# Patient Record
Sex: Female | Born: 1960 | Race: White | Hispanic: No | Marital: Single | Smoking: Current every day smoker
Health system: Southern US, Community
[De-identification: ages and names within clinical notes are randomized; demographics above are authoritative.]

## PROBLEM LIST (undated history)

## (undated) DIAGNOSIS — F32A Depression, unspecified: Secondary | ICD-10-CM

## (undated) DIAGNOSIS — N200 Calculus of kidney: Secondary | ICD-10-CM

## (undated) HISTORY — DX: Calculus of kidney: N20.0

## (undated) HISTORY — DX: Depression, unspecified: F32.A

---

## 2005-03-01 ENCOUNTER — Emergency Department: Payer: Self-pay | Admitting: Emergency Medicine

## 2008-03-31 DIAGNOSIS — E0590 Thyrotoxicosis, unspecified without thyrotoxic crisis or storm: Secondary | ICD-10-CM | POA: Insufficient documentation

## 2008-07-13 DIAGNOSIS — Z0000 Encounter for general adult medical examination without abnormal findings: Secondary | ICD-10-CM

## 2008-07-13 HISTORY — DX: Encounter for general adult medical examination without abnormal findings: Z00.00

## 2008-07-16 DIAGNOSIS — F17200 Nicotine dependence, unspecified, uncomplicated: Secondary | ICD-10-CM | POA: Insufficient documentation

## 2012-05-28 DIAGNOSIS — F411 Generalized anxiety disorder: Secondary | ICD-10-CM | POA: Insufficient documentation

## 2012-05-28 DIAGNOSIS — F1020 Alcohol dependence, uncomplicated: Secondary | ICD-10-CM | POA: Insufficient documentation

## 2012-06-01 DIAGNOSIS — E785 Hyperlipidemia, unspecified: Secondary | ICD-10-CM | POA: Insufficient documentation

## 2013-11-19 DIAGNOSIS — F4310 Post-traumatic stress disorder, unspecified: Secondary | ICD-10-CM | POA: Insufficient documentation

## 2018-10-08 ENCOUNTER — Ambulatory Visit
Admission: RE | Admit: 2018-10-08 | Discharge: 2018-10-08 | Disposition: A | Discharge status: Home / Self Care | Payer: Self-pay | Source: Ambulatory Visit | Attending: Oncology | Admitting: Oncology

## 2018-10-08 ENCOUNTER — Encounter: Payer: Self-pay | Admitting: *Deleted

## 2018-10-08 ENCOUNTER — Encounter (INDEPENDENT_AMBULATORY_CARE_PROVIDER_SITE_OTHER): Payer: Self-pay

## 2018-10-08 ENCOUNTER — Ambulatory Visit: Payer: Self-pay | Attending: Oncology | Admitting: *Deleted

## 2018-10-08 VITALS — BP 146/78 | HR 82 | Temp 98.0°F | Ht 65.6 in | Wt 151.8 lb

## 2018-10-08 DIAGNOSIS — Z0000 Encounter for general adult medical examination without abnormal findings: Secondary | ICD-10-CM

## 2018-10-08 NOTE — Progress Notes (Signed)
  Subjective:     Patient ID: Loreena Lemoine, female   DOB: 11-07-1960, 58 y.o.   MRN: [id number]  HPI   Review of Systems     Objective:   Physical Exam Chest:          Assessment:     58 year old White female referred to [facility] by [facility] for clinical breast exam and mammogram.  Clinical breast exam unremarkable.  Taught self breast awareness.  Last pap on 05/07/16 was negative without HPV co-testing.  Next pap due in August.  Informed patient she could have her pap with her primary care provider or we could do her pap next year when she returns for her annual screening.  Patient has been screened for eligibility.  She does not have any insurance, Medicare or Medicaid.  She also meets financial eligibility.  Hand-out given on the Affordable Care Act.  Risk Assessment    Risk Scores      10/08/2018   Last edited by: Alta Corning, CMA   5-year risk: 0.9 %   Lifetime risk: 5.7 %            Plan:     Screening mammogram ordered.  Will follow-up per [facility] protocol.  *

## 2018-10-08 NOTE — Patient Instructions (Signed)
Gave patient hand-out, Women Staying Healthy, Active and Well from [facility], with education on breast health, pap smears, heart and colon health. 

## 2018-10-21 ENCOUNTER — Encounter: Payer: Self-pay | Admitting: *Deleted

## 2018-10-21 NOTE — Progress Notes (Unsigned)
Letter mailed from the [facility] to inform patient of her normal mammogram results.  Patient is to follow-up with annual screening in one year.  HSIS to Christy. 

## 2020-11-05 ENCOUNTER — Ambulatory Visit
Admission: EM | Admit: 2020-11-05 | Discharge: 2020-11-05 | Disposition: A | Discharge status: Home / Self Care | Payer: Self-pay

## 2020-11-05 ENCOUNTER — Other Ambulatory Visit: Payer: Self-pay

## 2020-11-05 DIAGNOSIS — S46911A Strain of unspecified muscle, fascia and tendon at shoulder and upper arm level, right arm, initial encounter: Secondary | ICD-10-CM

## 2020-11-05 MED ORDER — METHOCARBAMOL 500 MG PO TABS: 500.0000 mg | ORAL_TABLET | Freq: Two times a day (BID) | ORAL | 0 refills | Status: DC

## 2020-11-05 MED ORDER — PREDNISONE 10 MG (21) PO TBPK: ORAL_TABLET | Freq: Every day | ORAL | 0 refills | Status: DC

## 2020-11-05 MED ORDER — HYDROCODONE-ACETAMINOPHEN 5-325 MG PO TABS: 2.0000 | ORAL_TABLET | ORAL | 0 refills | Status: DC | PRN

## 2020-11-05 NOTE — ED Triage Notes (Signed)
Pt c/o neck and shoulder (R) pain since this past October. Pt reports possible "lumps" in her upper arm. Pt reports lack of sleep due to pain. Pt has applied moist heat, topical pain relief and taking Tylenol. Pt also reports pain with movement of neck and shoulder. Pt denies any known injuries or accidents.

## 2020-11-05 NOTE — Discharge Instructions (Addendum)
Take the prednisone according to the package instructions.  Take the methocarbamol every 6 hours for the next 2 days on a schedule and then back down to as needed.  Use the Norco as needed for severe pain.  Do not take this and drive or while you are working as it will cause sedation.  Also do not drink alcohol while you are taking it.  Follow the shoulder range of motion exercises given your discharge paperwork.  If your symptoms do not improve in the next 7 to 10 days you need to follow-up with [facility].  They have an urgent care at their [place] office.

## 2020-11-05 NOTE — ED Provider Notes (Signed)
[facility]    CSN: [id number] Arrival date & time: 11/05/20  0955      History   Chief Complaint Chief Complaint  Patient presents with  . Neck Pain  . Shoulder Pain    right    HPI Vickie Hooper is a 60 y.o. female.   HPI   60 year old female here for evaluation of right neck and shoulder pain that is been going on for the past 5 months.  She reports that she went to bed 1 night in October and woke up with this pain and has been there ever since.  She has been using over-the-counter Tylenol, ibuprofen, Aspercreme, icy hot, and moist heat with out relief.  Patient works at [employer] and does a lot of overhead repetitive motion at work.  Patient denies numbness, tingling, weakness in her right arm.  History reviewed. No pertinent past medical history.  There are no problems to display for this patient.   History reviewed. No pertinent surgical history.  OB History   No obstetric history on file.      Home Medications    Prior to Admission medications   Medication Sig Start Date End Date Taking? Authorizing Provider  HYDROcodone-acetaminophen (NORCO/VICODIN) 5-325 MG tablet Take 2 tablets by mouth every 4 (four) hours as needed. 11/05/20  Yes Becky Augusta, NP  methocarbamol (ROBAXIN) 500 MG tablet Take 1 tablet (500 mg total) by mouth 2 (two) times daily. 11/05/20  Yes Becky Augusta, NP  predniSONE (STERAPRED UNI-PAK 21 TAB) 10 MG (21) TBPK tablet Take by mouth daily. Take 6 tabs by mouth daily  for 2 days, then 5 tabs for 2 days, then 4 tabs for 2 days, then 3 tabs for 2 days, 2 tabs for 2 days, then 1 tab by mouth daily for 2 days 11/05/20  Yes Becky Augusta, NP  cetirizine (ZYRTEC) 10 MG tablet Take 10 mg by mouth daily as needed. 06/20/20   [provider]  fluticasone (FLONASE) 50 MCG/ACT nasal spray Place 1 spray into both nostrils daily. 06/20/20   [provider]    Family History No family history on file.  Social History Social  History   Tobacco Use  . Smoking status: Current Every Day Smoker    Types: Cigarettes  . Smokeless tobacco: Never Used  Vaping Use  . Vaping Use: Never used  Substance Use Topics  . Alcohol use: Yes  . Drug use: Yes    Types: Marijuana     Allergies   Penicillins   Review of Systems Review of Systems  Constitutional: Negative for activity change and appetite change.  Musculoskeletal: Positive for arthralgias and myalgias.  Skin: Negative for color change.  Neurological: Negative for numbness.  Hematological: Negative.   Psychiatric/Behavioral: Negative.      Physical Exam Triage Vital Signs ED Triage Vitals  Enc Vitals Group     BP 11/05/20 1020 125/90     Pulse Rate 11/05/20 1020 94     Resp 11/05/20 1020 18     Temp 11/05/20 1020 97.9 F (36.6 C)     Temp Source 11/05/20 1020 Oral     SpO2 11/05/20 1020 97 %     Weight 11/05/20 1018 140 lb (63.5 kg)     Height 11/05/20 1018 5\' 7"  (1.702 m)     Head Circumference --      Peak Flow --      Pain Score 11/05/20 1017 10     Pain Loc --  Pain Edu? --      Excl. in GC? --    No data found.  Updated Vital Signs BP 125/90 (BP Location: Left Arm)   Pulse 94   Temp 97.9 F (36.6 C) (Oral)   Resp 18   Ht 5\' 7"  (1.702 m)   Wt 140 lb (63.5 kg)   SpO2 97%   BMI 21.93 kg/m   Visual Acuity Right Eye Distance:   Left Eye Distance:   Bilateral Distance:    Right Eye Near:   Left Eye Near:    Bilateral Near:     Physical Exam Vitals and nursing note reviewed.  Constitutional:      General: She is not in acute distress.    Appearance: Normal appearance. She is normal weight. She is not ill-appearing.  Musculoskeletal:        General: Tenderness present. No swelling or signs of injury. Normal range of motion.  Skin:    General: Skin is warm and dry.     Capillary Refill: Capillary refill takes less than 2 seconds.     Findings: No bruising or rash.  Neurological:     General: No focal deficit  present.     Mental Status: She is alert and oriented to person, place, and time.     Sensory: No sensory deficit.     Motor: No weakness.  Psychiatric:        Mood and Affect: Mood normal.        Behavior: Behavior normal.        Thought Content: Thought content normal.        Judgment: Judgment normal.      UC Treatments / Results  Labs (all labs ordered are listed, but only abnormal results are displayed) Labs Reviewed - No data to display  EKG   Radiology No results found.  Procedures Procedures (including critical care time)  Medications Ordered in UC Medications - No data to display  Initial Impression / Assessment and Plan / UC Course  I have reviewed the triage vital signs and the nursing notes.  Pertinent labs & imaging results that were available during my care of the patient were reviewed by me and considered in my medical decision making (see chart for details).   Evaluation of right neck and right shoulder pain that has been going on for the past 5 months.  Patient denies any injury but she does work at where she does a lot of overhead repetitive motion.  Physical exam reveals tenderness to the body of the trapezius muscle on the right side.  There are no trigger points palpated.  There is tenderness in the cervical branch as well as the superior aspect of the body of the trapezius.  Additionally, patient has tenderness over the biceps tendon of the right upper arm.  Patient has 5/5 grip strength and 5/5 upper extremity strength on the right.  Radial and ulnar pulses are 2+.  Sensation and range of motion are intact.  Will treat patient for muscle spasm in the trapezius muscle with prednisone pack, methocarbamol, and Norco for nighttime.  Patient advised that if her symptoms do not improve in 7 to 10 days that she needs to see orthopedics and given resources for [facility] in [place].   Final Clinical Impressions(s) / UC Diagnoses   Final diagnoses:   Strain of right shoulder, initial encounter   Discharge Instructions   None    ED Prescriptions    Medication Sig Dispense  Auth. Provider   predniSONE (STERAPRED UNI-PAK 21 TAB) 10 MG (21) TBPK tablet Take by mouth daily. Take 6 tabs by mouth daily  for 2 days, then 5 tabs for 2 days, then 4 tabs for 2 days, then 3 tabs for 2 days, 2 tabs for 2 days, then 1 tab by mouth daily for 2 days 42 tablet Becky Augusta, NP   methocarbamol (ROBAXIN) 500 MG tablet Take 1 tablet (500 mg total) by mouth 2 (two) times daily. 20 tablet Becky Augusta, NP   HYDROcodone-acetaminophen (NORCO/VICODIN) 5-325 MG tablet Take 2 tablets by mouth every 4 (four) hours as needed. 10 tablet Becky Augusta, NP     I have reviewed the PDMP during this encounter.   Becky Augusta, NP 11/05/20 1141

## 2021-02-20 ENCOUNTER — Emergency Department: Payer: Self-pay

## 2021-02-20 ENCOUNTER — Emergency Department
Admission: EM | Admit: 2021-02-20 | Discharge: 2021-02-20 | Disposition: A | Discharge status: Home / Self Care | Payer: Self-pay | Attending: Emergency Medicine | Admitting: Emergency Medicine

## 2021-02-20 ENCOUNTER — Other Ambulatory Visit: Payer: Self-pay

## 2021-02-20 DIAGNOSIS — R195 Other fecal abnormalities: Secondary | ICD-10-CM

## 2021-02-20 DIAGNOSIS — R0602 Shortness of breath: Secondary | ICD-10-CM

## 2021-02-20 DIAGNOSIS — R911 Solitary pulmonary nodule: Secondary | ICD-10-CM

## 2021-02-20 DIAGNOSIS — U071 COVID-19: Secondary | ICD-10-CM | POA: Insufficient documentation

## 2021-02-20 DIAGNOSIS — F17210 Nicotine dependence, cigarettes, uncomplicated: Secondary | ICD-10-CM | POA: Insufficient documentation

## 2021-02-20 DIAGNOSIS — R112 Nausea with vomiting, unspecified: Secondary | ICD-10-CM | POA: Insufficient documentation

## 2021-02-20 DIAGNOSIS — E876 Hypokalemia: Secondary | ICD-10-CM | POA: Insufficient documentation

## 2021-02-20 DIAGNOSIS — R55 Syncope and collapse: Secondary | ICD-10-CM | POA: Insufficient documentation

## 2021-02-20 DIAGNOSIS — K921 Melena: Secondary | ICD-10-CM | POA: Insufficient documentation

## 2021-02-20 LAB — BASIC METABOLIC PANEL
Anion gap: 13 (ref 5–15)
BUN: 8 mg/dL (ref 6–20)
CO2: 23 mmol/L (ref 22–32)
Calcium: 8.1 mg/dL — ABNORMAL LOW (ref 8.9–10.3)
Chloride: 94 mmol/L — ABNORMAL LOW (ref 98–111)
Creatinine, Ser: 0.82 mg/dL (ref 0.44–1.00)
GFR, Estimated: >60 mL/min (ref >60)
Glucose, Bld: 119 mg/dL — ABNORMAL HIGH (ref 70–99)
Potassium: 3.2 mmol/L — ABNORMAL LOW (ref 3.5–5.1)
Sodium: 130 mmol/L — ABNORMAL LOW (ref 135–145)

## 2021-02-20 LAB — HEPATIC FUNCTION PANEL
ALT: 84 U/L — ABNORMAL HIGH (ref 0–44)
AST: 138 U/L — ABNORMAL HIGH (ref 15–41)
Albumin: 3 g/dL — ABNORMAL LOW (ref 3.5–5.0)
Alkaline Phosphatase: 66 U/L (ref 38–126)
Bilirubin, Direct: <0.1 mg/dL (ref 0.0–0.2)
Total Bilirubin: 0.5 mg/dL (ref 0.3–1.2)
Total Protein: 5.9 g/dL — ABNORMAL LOW (ref 6.5–8.1)

## 2021-02-20 LAB — CBC
HCT: 41.4 % (ref 36.0–46.0)
Hemoglobin: 14.1 g/dL (ref 12.0–15.0)
MCH: 32.8 pg (ref 26.0–34.0)
MCHC: 34.1 g/dL (ref 30.0–36.0)
MCV: 96.3 fL (ref 80.0–100.0)
Platelets: 151 10*3/uL (ref 150–400)
RBC: 4.3 MIL/uL (ref 3.87–5.11)
RDW: 13.3 % (ref 11.5–15.5)
WBC: 8.3 10*3/uL (ref 4.0–10.5)
nRBC: 0 % (ref 0.0–0.2)

## 2021-02-20 LAB — D-DIMER, QUANTITATIVE: D-Dimer, Quant: 0.87 ug/mL-FEU — ABNORMAL HIGH (ref 0.00–0.50)

## 2021-02-20 LAB — MAGNESIUM: Magnesium: 1.8 mg/dL (ref 1.7–2.4)

## 2021-02-20 LAB — TROPONIN I (HIGH SENSITIVITY)
Troponin I (High Sensitivity): 5 ng/L (ref <18)
Troponin I (High Sensitivity): 7 ng/L (ref <18)

## 2021-02-20 LAB — RESP PANEL BY RT-PCR (FLU A&B, COVID) ARPGX2
Influenza A by PCR: NEGATIVE
Influenza B by PCR: NEGATIVE
SARS Coronavirus 2 by RT PCR: POSITIVE — AB

## 2021-02-20 LAB — LIPASE, BLOOD: Lipase: 52 U/L — ABNORMAL HIGH (ref 11–51)

## 2021-02-20 MED ORDER — POTASSIUM CHLORIDE 10 MEQ/100ML IV SOLN
10.0000 meq | INTRAVENOUS | Status: AC
  Administered 2021-02-20 (×2): 10 meq via INTRAVENOUS
  Filled 2021-02-20 (×2): qty 100

## 2021-02-20 MED ORDER — ACETAMINOPHEN 500 MG PO TABS
1000.0000 mg | ORAL_TABLET | Freq: Once | ORAL | Status: AC
  Administered 2021-02-20: 1000 mg via ORAL
  Filled 2021-02-20: qty 2

## 2021-02-20 MED ORDER — POTASSIUM CHLORIDE CRYS ER 20 MEQ PO TBCR
40.0000 meq | EXTENDED_RELEASE_TABLET | Freq: Once | ORAL | Status: AC
  Administered 2021-02-20: 40 meq via ORAL
  Filled 2021-02-20: qty 2

## 2021-02-20 MED ORDER — PANTOPRAZOLE SODIUM 40 MG IV SOLR
40.0000 mg | Freq: Once | INTRAVENOUS | Status: AC
  Administered 2021-02-20: 40 mg via INTRAVENOUS
  Filled 2021-02-20: qty 40

## 2021-02-20 MED ORDER — NIRMATRELVIR/RITONAVIR (PAXLOVID)TABLET: 3.0000 | ORAL_TABLET | Freq: Two times a day (BID) | ORAL | 0 refills | Status: AC

## 2021-02-20 MED ORDER — SODIUM CHLORIDE 0.9 % IV BOLUS
1000.0000 mL | Freq: Once | INTRAVENOUS | Status: AC
  Administered 2021-02-20: 1000 mL via INTRAVENOUS

## 2021-02-20 MED ORDER — MAGNESIUM SULFATE 2 GM/50ML IV SOLN
2.0000 g | Freq: Once | INTRAVENOUS | Status: DC
  Filled 2021-02-20: qty 50

## 2021-02-20 MED ORDER — IOHEXOL 350 MG/ML SOLN
75.0000 mL | Freq: Once | INTRAVENOUS | Status: AC | PRN
  Administered 2021-02-20: 75 mL via INTRAVENOUS

## 2021-02-20 NOTE — ED Provider Notes (Signed)
-----------------------------------------   3:12 PM on 02/20/2021 -----------------------------------------  Pulse 80, temperature 97.7 F (36.5 C), temperature source Oral, resp. rate 20, height 5\' 7"  (1.702 m), weight 59 kg, SpO2 94 %.  Assuming care from Dr. .  In short, Vickie Hooper is a 60 y.o. female with a chief complaint of Chest Pain .  Refer to the original H&P for additional details.  The current plan of care is to follow-up d-dimer and CT chest to further assess possible pneumonia.  ----------------------------------------- 5:09 PM on 02/20/2021 -----------------------------------------  D-dimer is elevated, CTA performed and is negative for PE, does show possible pulmonary nodule and groundglass opacities.  Patient informed of pulmonary nodule and provided with referral to pulmonary nodule clinic.  Groundglass opacities likely due to COVID-19 as patient's PCR test came back positive.  She continues to maintain O2 sats on room air, was offered admission for observation for near syncope and possible GI bleed, but declines.  She was advised to stop taking high-dose aspirin and to return to the ED for worsening bloody stool, difficulty breathing, or any other concerning symptoms.  She was counseled to follow-up with her PCP and was prescribed Paxlovid as she has only been symptomatic for 2 days.  Patient agrees with plan.    02/22/2021, MD 02/20/21 [phone]

## 2021-02-20 NOTE — ED Triage Notes (Signed)
Pt to ED [facility] for chest feeling abnormal and N/v since saturday. Unable to eat properly since Friday, report black tarry stools 500 bolus and 4mg  zofran given PTA 18 G right AC

## 2021-02-20 NOTE — ED Provider Notes (Signed)
[facility] Emergency Department Provider Note  ____________________________________________   Event Date/Time   First MD Initiated Contact with Patient 02/20/21 1323     (approximate)  I have reviewed the triage vital signs and the nursing notes.   HISTORY  Chief Complaint Chest Pain    HPI Vickie Hooper is a 60 y.o. female with allergies, smoking history but no prior history of lung issues who comes in with near syncopal episode and chest pain.  Patient reports that she has not been feeling well for the past few days.  She states that she is not been able to eat or drink due to having some nausea and vomiting.  This is been intermittent, nothing makes it better, nothing makes it worse.  Patient was given some Zofran and 500 cc of fluids with EMS.  Patient does report 1 episode of black dark stools that started today.  Does not have a history of GI bleeds however she does take 324 of aspirin daily.  States that today that she was at work and she started feeling like her vision was going black like she was going to pass out and developed some midsternal chest pain.  Denies any shortness of breath at this time or chest pain at this time.  No unilateral leg swelling, no recent long travel, recent surgery, history of blood clots.          History reviewed. No pertinent past medical history.  There are no problems to display for this patient.   History reviewed. No pertinent surgical history.  Prior to Admission medications   Medication Sig Start Date End Date Taking? Authorizing Provider  cetirizine (ZYRTEC) 10 MG tablet Take 10 mg by mouth daily as needed. 06/20/20   [provider]  fluticasone (FLONASE) 50 MCG/ACT nasal spray Place 1 spray into both nostrils daily. 06/20/20   [provider]  HYDROcodone-acetaminophen (NORCO/VICODIN) 5-325 MG tablet Take 2 tablets by mouth every 4 (four) hours as needed. 11/05/20   Becky Augusta, NP   methocarbamol (ROBAXIN) 500 MG tablet Take 1 tablet (500 mg total) by mouth 2 (two) times daily. 11/05/20   Becky Augusta, NP  predniSONE (STERAPRED UNI-PAK 21 TAB) 10 MG (21) TBPK tablet Take by mouth daily. Take 6 tabs by mouth daily  for 2 days, then 5 tabs for 2 days, then 4 tabs for 2 days, then 3 tabs for 2 days, 2 tabs for 2 days, then 1 tab by mouth daily for 2 days 11/05/20   Becky Augusta, NP    Allergies Penicillins  No family history on file.  Social History Social History   Tobacco Use  . Smoking status: Current Every Day Smoker    Types: Cigarettes  . Smokeless tobacco: Never Used  Vaping Use  . Vaping Use: Never used  Substance Use Topics  . Alcohol use: Yes  . Drug use: Yes    Types: Marijuana      Review of Systems Constitutional: No fever/chills, near-syncope Eyes: No visual changes. ENT: No sore throat. Cardiovascular: Positive chest pain Respiratory: Positive shortness of breath Gastrointestinal: No abdominal pain.  Positive nausea and vomiting no diarrhea.  No constipation.  Black stool Genitourinary: Negative for dysuria. Musculoskeletal: Negative for back pain. Skin: Negative for rash. Neurological: Negative for headaches, focal weakness or numbness. All other ROS negative ____________________________________________   PHYSICAL EXAM:  VITAL SIGNS: ED Triage Vitals  Enc Vitals Group     BP --  Pulse Rate 02/20/21 1326 80     Resp 02/20/21 1326 20     Temp 02/20/21 1326 97.7 F (36.5 C)     Temp Source 02/20/21 1326 Oral     SpO2 02/20/21 1326 94 %     Weight 02/20/21 1323 130 lb (59 kg)     Height 02/20/21 1323 5\' 7"  (1.702 m)     Head Circumference --      Peak Flow --      Pain Score 02/20/21 1321 0     Pain Loc --      Pain Edu? --      Excl. in GC? --     Constitutional: Alert and oriented. Well appearing and in no acute distress. Eyes: Conjunctivae are normal. EOMI. Head: Atraumatic. Nose: No  congestion/rhinnorhea. Mouth/Throat: Mucous membranes are moist.   Neck: No stridor. Trachea Midline. FROM Cardiovascular: Normal rate, regular rhythm. Grossly normal heart sounds.  Good peripheral circulation. Respiratory: Normal respiratory effort.  No retractions. Lungs CTAB. Gastrointestinal: Soft and nontender. No distention. No abdominal bruits.  Musculoskeletal: No lower extremity tenderness nor edema.  No joint effusions. Neurologic:  Normal speech and language. No gross focal neurologic deficits are appreciated.  Skin:  Skin is warm, dry and intact. No rash noted. Psychiatric: Mood and affect are normal. Speech and behavior are normal. GU: Deferred   ____________________________________________   LABS (all labs ordered are listed, but only abnormal results are displayed)  Labs Reviewed  BASIC METABOLIC PANEL - Abnormal; Notable for the following components:      Result Value   Sodium 130 (*)    Potassium 3.2 (*)    Chloride 94 (*)    Glucose, Bld 119 (*)    Calcium 8.1 (*)    All other components within normal limits  CBC  HEPATIC FUNCTION PANEL  LIPASE, BLOOD  TROPONIN I (HIGH SENSITIVITY)   ____________________________________________   ED ECG REPORT I, Concha SeMary E Drelyn Pistilli, the attending physician, personally viewed and interpreted this ECG.  Normal sinus rate of 79, no ST elevation, no T wave inversions, normal intervals ____________________________________________  RADIOLOGY Vela ProseI, Michelle Vanhise E Rehaan Viloria, personally viewed and evaluated these images (plain radiographs) as part of my medical decision making, as well as reviewing the written report by the radiologist.  ED MD interpretation: Bilateral infiltrates noted  Official radiology report(s): DG Chest 2 View  Result Date: 02/20/2021 CLINICAL DATA:  60 year old female with history of chest pain. EXAM: CHEST - 2 VIEW COMPARISON:  Chest x-ray 03/01/2005. FINDINGS: Widespread areas of interstitial prominence are noted  throughout the lungs bilaterally. Areas of cylindrical bronchiectasis are noted, most evident in the right upper lobe, particularly near the apex where there is associated pleuroparenchymal thickening and architectural distortion, which likely reflects chronic post infectious or inflammatory scarring. No confluent consolidative airspace disease. No pleural effusions. No pneumothorax. No evidence of pulmonary edema. Heart size is normal. Upper mediastinal contours are within normal limits. IMPRESSION: 1. 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 This could be better evaluated with follow-up noncontrast chest CT. Electronically Signed   By: Trudie Reedaniel  Entrikin M.D.   On: 02/20/2021 14:13   CT Head Wo Contrast  Result Date: 02/20/2021 CLINICAL DATA:  Pt states she fell and hit the Right side of her head approximately 3 weeks ago. She c/o H/A with intermittent dizziness ever since. She states today she had the an episode of near syncope with diarophesis. No hx  CA, CVA, brain aneurysm or seizures. EXAM: CT HEAD WITHOUT CONTRAST TECHNIQUE: Contiguous axial images were obtained from the base of the skull through the vertex without intravenous contrast. COMPARISON:  None. FINDINGS: Brain: No evidence of acute infarction, hemorrhage, hydrocephalus, extra-axial collection or mass lesion/mass effect. Vascular: No hyperdense vessel or unexpected calcification. Skull: Normal. Negative for fracture or focal lesion. Sinuses/Orbits: Visualized globes and orbits are unremarkable. Visualized sinuses are clear. Other: None. IMPRESSION: Normal unenhanced CT scan of the brain. Electronically Signed   By: Amie Portland M.D.   On: 02/20/2021 14:55    ____________________________________________   PROCEDURES  Procedure(s) performed (including Critical Care):  .1-3 Lead EKG Interpretation Performed by: Concha Se, MD Authorized by:  Concha Se, MD     Interpretation: normal     ECG rate:  80s   ECG rate assessment: normal     Rhythm: sinus rhythm     Ectopy: none     Conduction: normal       ____________________________________________   INITIAL IMPRESSION / ASSESSMENT AND PLAN / ED COURSE   Sandi Towe was evaluated in Emergency Department on 02/20/2021 for the symptoms described in the history of present illness. She was evaluated in the context of the global COVID-19 pandemic, which necessitated consideration that the patient might be at risk for infection with the SARS-CoV-2 virus that causes COVID-19. Institutional protocols and algorithms that pertain to the evaluation of patients at risk for COVID-19 are in a state of rapid change based on information released by regulatory bodies including the CDC and federal and state organizations. These policies and algorithms were followed during the patient's care in the ED.    Most Likely DDx:  -MSK (atypical chest pain) and presyncopal most likely secondary to dehydration from the nausea and vomiting.  Will get COVID swab.  Will get cardiac markers to make sure evidence of ACS.  We will give some additional fluid and keep patient on the cardiac monitor to evaluate for arrhythmia.  Patient does report dark stool I did a rectal exam and it was brown but was Hemoccult positive.     DDx that was also considered d/t potential to cause harm, but was found less likely based on history and physical (as detailed above): -PNA (no fevers, cough but CXR to evaluate) -PNX (reassured with equal b/l breath sounds, CXR to evaluate) -Symptomatic anemia (will get H&H) -Pulmonary embolism as no sob at rest, not pleuritic in nature, no hypoxia -Aortic Dissection as no tearing pain and no radiation to the mid back, pulses equal -Pericarditis no rub on exam, EKG changes or hx to suggest dx -Tamponade (no notable SOB, tachycardic, hypotensive) -Esophageal rupture (no h/o diffuse  vomitting/no crepitus)   Given Hemoccult positive stools with patient stating that she had black stools this morning concerning for upper GI bleed especially given the amount of aspirin she is on.  We will give a dose of Protonix.  Her LFTs are and lipase are slightly elevated.  No abdominal tenderness to suggest acute cholecystitis.  However patient does report that she drinks daily.  She could have some underlying cirrhosis.  Her chest x-ray was concerning for possible MAI and recommended CT imaging.  However I had already ordered the D-dimer to evaluate for PE given her low oxygen levels.  If negative will proceed with CT without and if positive will get CT with contrast to evaluate for PE.  We will also get a CT abdomen to rule out abdominal processes  given the nausea and vomiting.  Suspect patient will need admission for syncopal episode, melena, further work-up for her possible lung disease.  I have given patient some fluids given presumed and Tylenol to help with discomfort.  Patient handed off to oncoming team.     ____________________________________________   FINAL CLINICAL IMPRESSION(S) / ED DIAGNOSES   Final diagnoses:  Near syncope  Heme + stool  Shortness of breath  Hypokalemia     MEDICATIONS GIVEN DURING THIS VISIT:  Medications  sodium chloride 0.9 % bolus 1,000 mL (has no administration in time range)  potassium chloride 10 mEq in 100 mL IVPB (has no administration in time range)  acetaminophen (TYLENOL) tablet 1,000 mg (1,000 mg Oral Given 02/20/21 1526)  potassium chloride SA (KLOR-CON) CR tablet 40 mEq (40 mEq Oral Given 02/20/21 1526)  pantoprazole (PROTONIX) injection 40 mg (40 mg Intravenous Given 02/20/21 1530)     ED Discharge Orders    None       Note:  This document was prepared using Dragon voice recognition software and may include unintentional dictation errors.   Concha Se, MD 02/20/21 [phone]

## 2021-02-20 NOTE — ED Notes (Signed)
Report given to Mimi RN.  

## 2021-02-22 ENCOUNTER — Telehealth: Payer: Self-pay | Admitting: *Deleted

## 2021-02-22 NOTE — Telephone Encounter (Signed)
Referral received for pt to be seen in Lung Nodule Clinic for further workup of incidental lung nodule with follow up CT scan. Pt made aware of lung nodule clinic. Contact info given and instructed to call with any questions or needs. Pt aware that will be called once appts have been scheduled. Pt verbalized understanding.

## 2021-03-21 DIAGNOSIS — E875 Hyperkalemia: Secondary | ICD-10-CM | POA: Insufficient documentation

## 2021-03-21 HISTORY — DX: Hyperkalemia: E87.5

## 2021-06-19 ENCOUNTER — Telehealth: Payer: Self-pay | Admitting: *Deleted

## 2021-06-19 DIAGNOSIS — R911 Solitary pulmonary nodule: Secondary | ICD-10-CM

## 2021-06-19 NOTE — Telephone Encounter (Signed)
Pt scheduled for follow up CT scan and visit in the lung nodule clinic in Feb 2023. Message left with patient with appt details. Instructed to call back with any questions.

## 2021-11-14 ENCOUNTER — Ambulatory Visit
Admission: RE | Admit: 2021-11-14 | Discharge: 2021-11-14 | Disposition: A | Discharge status: Home / Self Care | Payer: Self-pay | Source: Ambulatory Visit | Attending: Oncology | Admitting: Oncology

## 2021-11-14 ENCOUNTER — Other Ambulatory Visit: Payer: Self-pay

## 2021-11-14 DIAGNOSIS — R911 Solitary pulmonary nodule: Secondary | ICD-10-CM | POA: Insufficient documentation

## 2021-11-16 ENCOUNTER — Encounter: Payer: Self-pay | Admitting: Nurse Practitioner

## 2021-11-16 ENCOUNTER — Inpatient Hospital Stay: Payer: Self-pay | Attending: Oncology | Admitting: Nurse Practitioner

## 2021-11-16 DIAGNOSIS — R911 Solitary pulmonary nodule: Secondary | ICD-10-CM

## 2021-11-16 DIAGNOSIS — R918 Other nonspecific abnormal finding of lung field: Secondary | ICD-10-CM | POA: Insufficient documentation

## 2021-11-16 NOTE — Progress Notes (Signed)
Pulmonary Nodule Clinic Consult note [facility]  Telephone:([phone] [phone]  Patient Care Team: [place] as PCP - General Rico Junker, RN as Registered Nurse Theodore Demark, RN as Registered Nurse   Name of the patient: Vickie Hooper  [id number]  05-03-1961   Date of visit: 11/16/2021   Diagnosis- Lung Nodule  Virtual Visit via Telephone Note   I connected with Kennon Rounds on 11/16/21 at 22 AM by telephone visit and verified that I am speaking with the correct person using two identifiers.   I discussed the limitations, risks, security and privacy concerns of performing an evaluation and management service by telemedicine and the availability of in-person appointments. I also discussed with the patient that there may be a patient responsible charge related to this service. The patient expressed understanding and agreed to proceed.   Other persons participating in the visit and their role in the encounter:   Patient's location: home  Provider's location: clinic  Chief complaint/ Reason for visit- Pulmonary Nodule Clinic Initial Visit  Past Medical History:  Patient is managed/referred by PCP  Interval history-patient is 61 year old female who is referred to pulmonary nodule clinic for incidental finding of lung nodule.  Patient was undergoing work-up in ER for COVID infection and was found to have elevated D-dimer.  This prompted CT angio which showed incidental pulmonary nodule.  In the interim, she has had follow-up CT scan presents for results.  She overall feels well and denies worsening cough, chest pain, hemoptysis, unintentional weight loss.  ECOG FS:0 - Asymptomatic  Review of systems- Review of Systems  Constitutional:  Negative for chills, fever, malaise/fatigue and weight loss.  HENT:  Negative for hearing loss, nosebleeds, sore throat and tinnitus.   Eyes:  Negative for blurred vision and  double vision.  Respiratory:  Negative for cough, hemoptysis, shortness of breath and wheezing.   Cardiovascular:  Negative for chest pain, palpitations and leg swelling.  Gastrointestinal:  Negative for abdominal pain, blood in stool, constipation, diarrhea, melena, nausea and vomiting.  Genitourinary:  Negative for dysuria and urgency.  Musculoskeletal:  Negative for back pain, falls, joint pain and myalgias.  Skin:  Negative for itching and rash.  Neurological:  Negative for dizziness, tingling, sensory change, loss of consciousness, weakness and headaches.  Endo/Heme/Allergies:  Negative for environmental allergies. Does not bruise/bleed easily.  Psychiatric/Behavioral:  Negative for depression. The patient is not nervous/anxious and does not have insomnia.     Allergies  Allergen Reactions   Penicillins      Past Medical History:  Diagnosis Date   Depression      Past Surgical History:  Procedure Laterality Date   CESAREAN SECTION  08/14/1989    Social History   Socioeconomic History   Marital status: Single    Spouse name: Not on file   Number of children: Not on file   Years of education: Not on file   Highest education level: Not on file  Occupational History   Not on file  Tobacco Use   Smoking status: Every Day    Types: Cigarettes   Smokeless tobacco: Never  Vaping Use   Vaping Use: Never used  Substance and Sexual Activity   Alcohol use: Yes    Alcohol/week: 6.0 standard drinks    Types: 6 Cans of beer per week    Comment: 6 beers a day   Drug use: Yes    Types: Marijuana   Sexual activity: Not  on file  Other Topics Concern   Not on file  Social History Narrative   Not on file   Social Determinants of Health   Financial Resource Strain: Not on file  Food Insecurity: Not on file  Transportation Needs: Not on file  Physical Activity: Not on file  Stress: Not on file  Social Connections: Not on file  Intimate Partner Violence: Not on file     Family History  Problem Relation Age of Onset   Esophageal cancer Mother    COPD Mother    Cancer Father      Current Outpatient Medications:    cetirizine (ZYRTEC) 10 MG tablet, Take 10 mg by mouth daily as needed., Disp: , Rfl:    fluticasone (FLONASE) 50 MCG/ACT nasal spray, Place 1 spray into both nostrils daily., Disp: , Rfl:   Physical exam: There were no vitals filed for this visit. Physical Exam Pulmonary:     Effort: No respiratory distress.     CMP Latest Ref Rng & Units 02/20/2021  Glucose 70 - 99 mg/dL 119(H)  BUN 6 - 20 mg/dL 8  Creatinine 0.44 - 1.00 mg/dL 0.82  Sodium 135 - 145 mmol/L 130(L)  Potassium 3.5 - 5.1 mmol/L 3.2(L)  Chloride 98 - 111 mmol/L 94(L)  CO2 22 - 32 mmol/L 23  Calcium 8.9 - 10.3 mg/dL 8.1(L)  Total Protein 6.5 - 8.1 g/dL 5.9(L)  Total Bilirubin 0.3 - 1.2 mg/dL 0.5  Alkaline Phos 38 - 126 U/L 66  AST 15 - 41 U/L 138(H)  ALT 0 - 44 U/L 84(H)   CBC Latest Ref Rng & Units 02/20/2021  WBC 4.0 - 10.5 K/uL 8.3  Hemoglobin 12.0 - 15.0 g/dL 14.1  Hematocrit 36.0 - 46.0 % 41.4  Platelets 150 - 400 K/uL 151    No images are attached to the encounter.  CT Chest Wo Contrast  Result Date: 11/16/2021 CLINICAL DATA:  Follow-up lung nodule EXAM: CT CHEST WITHOUT CONTRAST TECHNIQUE: Multidetector CT imaging of the chest was performed following the standard protocol without IV contrast. RADIATION DOSE REDUCTION: This exam was performed according to the departmental dose-optimization program which includes automated exposure control, adjustment of the mA and/or kV according to patient size and/or use of iterative reconstruction technique. COMPARISON:  02/20/2021 FINDINGS: Cardiovascular: Heart size normal. No pericardial effusion. Aortic and coronary artery atherosclerotic calcifications noted. Mediastinum/Nodes: No enlarged mediastinal or axillary lymph nodes. Thyroid gland, trachea, and esophagus demonstrate no significant findings. Lungs/Pleura:  Moderate to severe changes of paraseptal and centrilobular emphysema. Diffuse bronchial wall thickening noted. No pleural effusion, airspace consolidation, or pneumothorax. Scarring and architectural distortion is identified within the right upper lobe/apex, image 27/3 and image 30/3. The central nodular density within this area identified on the previous exam is again noted and appears stable in the interval measuring 7 mm, image 27/3. Upper Abdomen: No acute findings. 2 mm stone identified within upper pole collecting system of the right kidney. Musculoskeletal: No chest wall mass or suspicious bone lesions identified. IMPRESSION: 1. Stable appearance of right upper lobe/apex scarring and architectural distortion. The central nodular density within this area is stable measuring 7 mm. Future CT at 18-24 months (from 02/20/2021) is considered optional for low-risk patients, but is recommended for high-risk patients. This recommendation follows the consensus statement: Guidelines for management of Incidental Pulmonary Nodules Detected on CT Images: from the Fleischner Society 2017; Radiology 2017; 284:228-243. 2. Diffuse bronchial wall thickening with emphysema, as above; imaging findings suggestive of underlying COPD. 3.  Nonobstructing right renal calculus. 4. Aortic Atherosclerosis (ICD10-I70.0) and Emphysema (ICD10-J43.9). Electronically Signed   By: Kerby Moors M.D.   On: 11/16/2021 10:50     Assessment and plan- Patient is a 61 y.o. female who presents to pulmonary nodule clinic for follow-up of incidental lung nodules.  A telephone visit was conducted to review most recent CT scan results.    CT chest without contrast from today 11/14/2021 revealed was independently reviewed and I agree with findings as below including stable appearing 7 mm right upper lobe lung nodule.  This is similar to CT angio from May 2022.  No associated lymphadenopathy.  She has diffuse bronchial wall thickening consistent with  COPD.   Calculating malignancy probability of a pulmonary nodule: Risk factors include: 1.  Age. 2.  Cancer history. 3.  Diameter of pulmonary nodule and mm 4.  Location 5.  Smoking history- pack a day. Started smoking at age 74.  63.  Spiculation present   Based on risk factors, this patient is high risk for the development of lung cancer even in absence of a lung nodule.  I would recommend a 18-50-month follow-up with imaging to ensure stability given smoking history.  If imaging stable at that time, would recommend follow-up with annual low-dose CT screening. Will make referral if appropriate.   During our visit, we discussed pulmonary nodules are a common incidental finding and are often how lung cancer is discovered.  Lung cancer survival is directly related to the stage at diagnosis.  We discussed that nodules can vary in presentation from solitary pulmonary nodules to masses, 2 groundglass opacities and multiple nodules.  Pulmonary nodules in the majority of cases are benign but the probability of these becoming malignant cannot be undermined.  Early identification of malignant nodules could lead to early diagnosis and increased survival.   We discussed the probability of pulmonary nodules becoming malignant increase with age, pack years of tobacco use, size/characteristics of the nodule and location; with upper lobe involvement being most worrisome.  We discussed the goal of our clinic is to thoroughly evaluate each nodule, developed a comprehensive, individualized plan of care utilizing the most advanced technology and significantly reduce the time from detection to treatment.  A dedicated pulmonary nodule clinic has proven to indeed expedite the detection and treatment of lung cancer.  Patient education in fact sheet provided along with most recent CT scans.  Disposition: 18 months CT chest without contrast and see me a few days later for results  Visit Diagnosis 1. Lung nodule      Patient expressed understanding and was in agreement with this plan. She also understands that She can call clinic at any time with any questions, concerns, or complaints.   Greater than 50% was spent in counseling and coordination of care with this patient including but not limited to discussion of the relevant topics above (See A&P) including, but not limited to diagnosis and management of acute and chronic medical conditions.   Thank you for allowing me to participate in the care of this patient.   Verlon Au, NP 11/16/2021

## 2021-12-27 IMAGING — CT CT HEAD W/O CM
3 series · 15 of 45 positions shown, 18 images · non-contrast
Comparison: None.

CLINICAL DATA: Pt states she fell and hit the Right side of her
head approximately 3 weeks ago. She c/o H/A with intermittent
dizziness ever since. She states today she had the an episode of
near syncope with diarophesis. No hx CA, CVA, brain aneurysm or
seizures.

EXAM:
CT HEAD WITHOUT CONTRAST
TECHNIQUE: Contiguous axial images were obtained from the base of the skull
through the vertex without intravenous contrast.

[Series 2: head wo · axial · 0.39mm/px · z∈[-157,-42]mm · 9 of 28 slices shown, 12 images]
[im 3/28  brain]
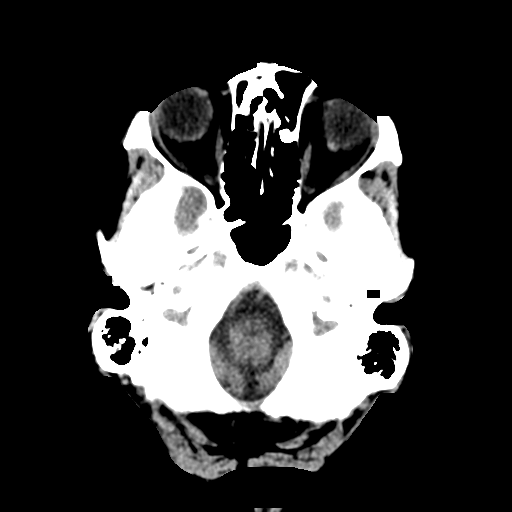
[im 3/28  bone]
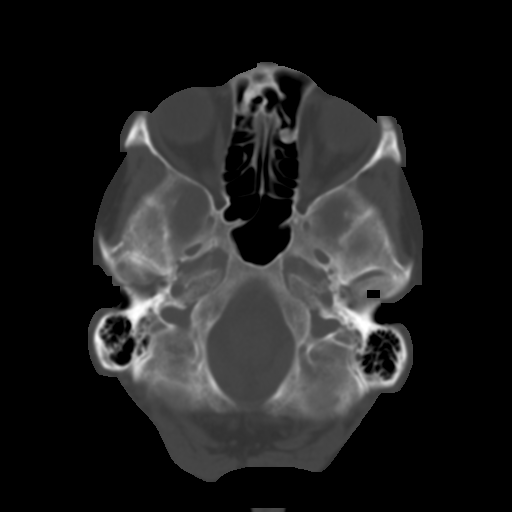
[im 6/28  brain]
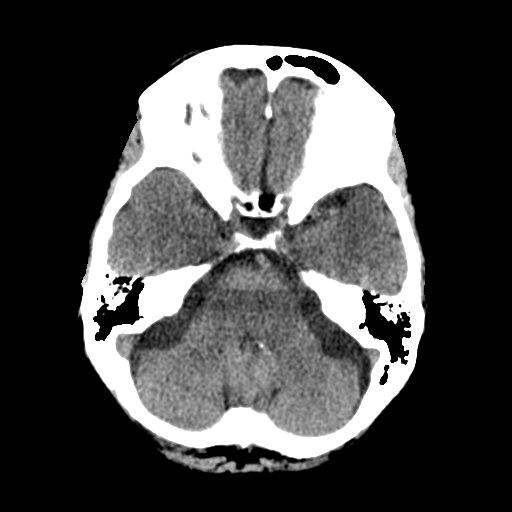
[im 9/28  brain]
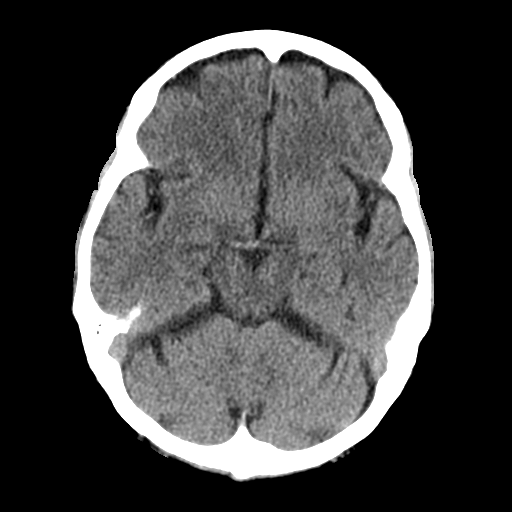
[im 12/28  brain]
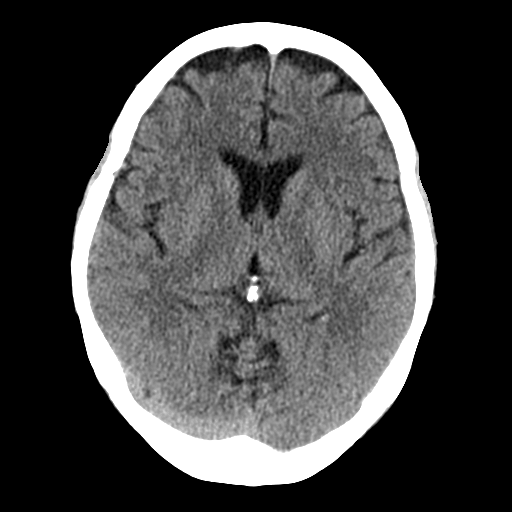
[im 15/28  brain]
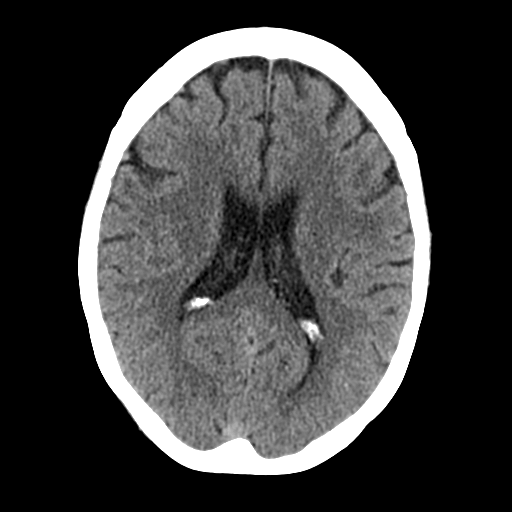
[im 15/28  bone]
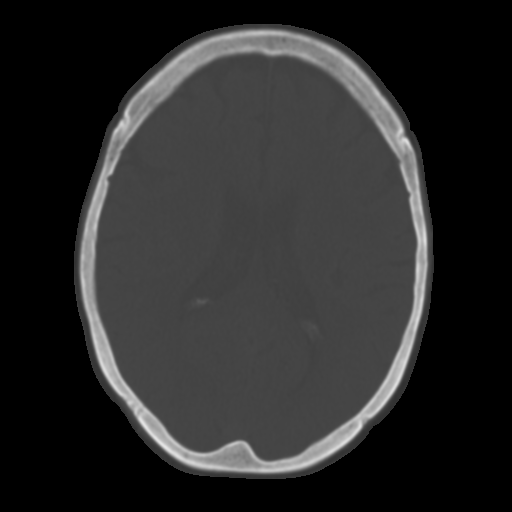
[im 17/28  brain]
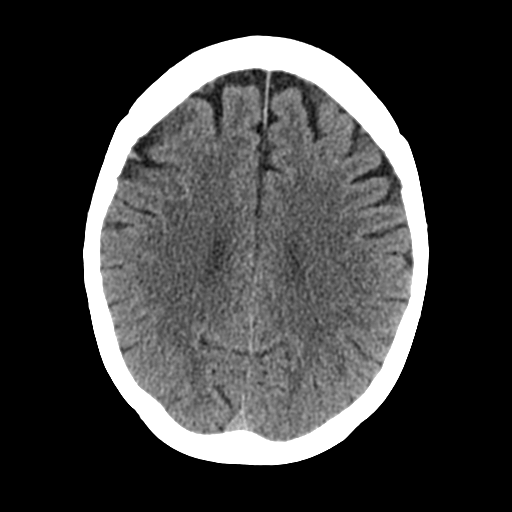
[im 20/28  brain]
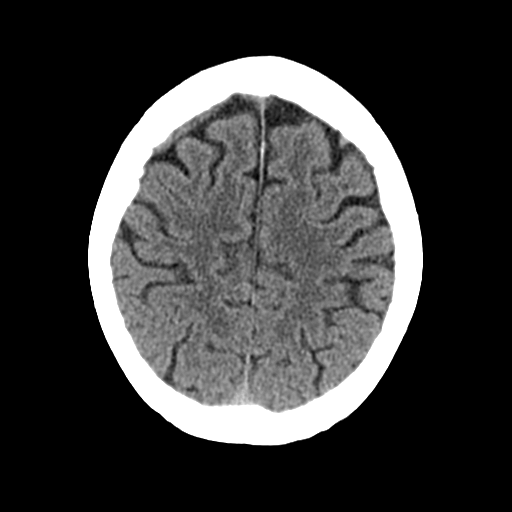
[im 23/28  brain]
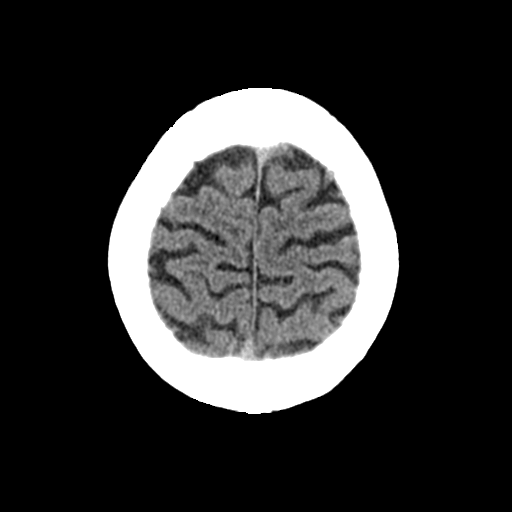
[im 26/28  brain]
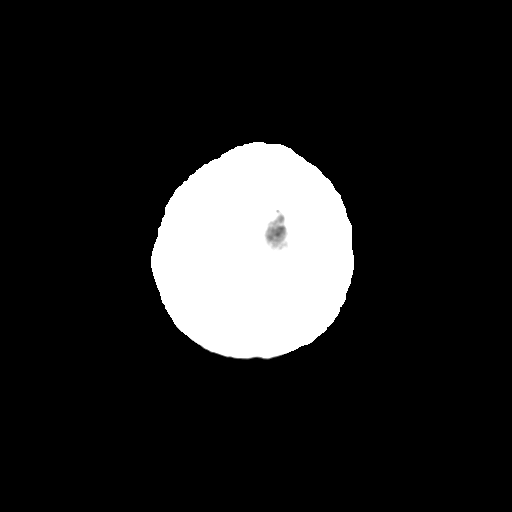
[im 26/28  bone]
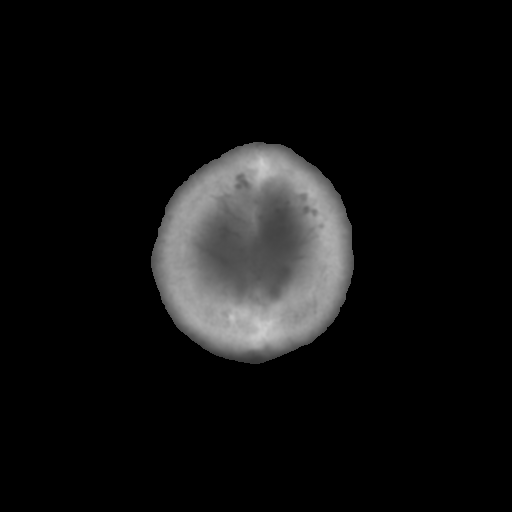

[Series 4: coronal soft tissue · coronal · 0.29mm/px · 3 of 66 slices shown]
[im 22/66  brain]
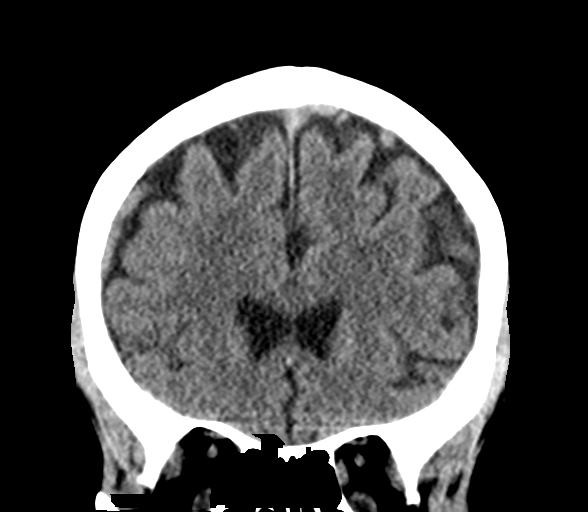
[im 29/66  brain]
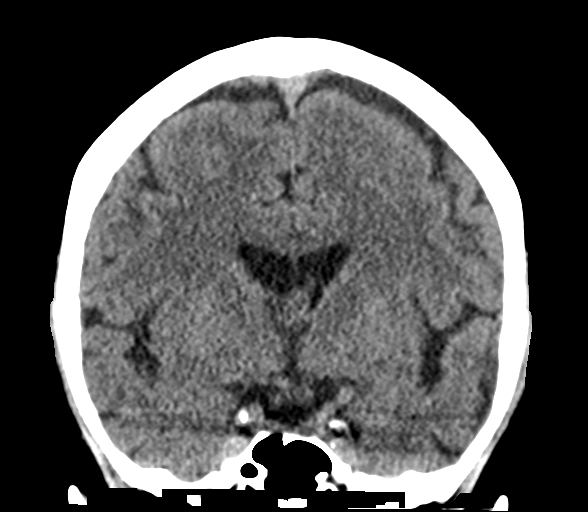
[im 37/66  brain]
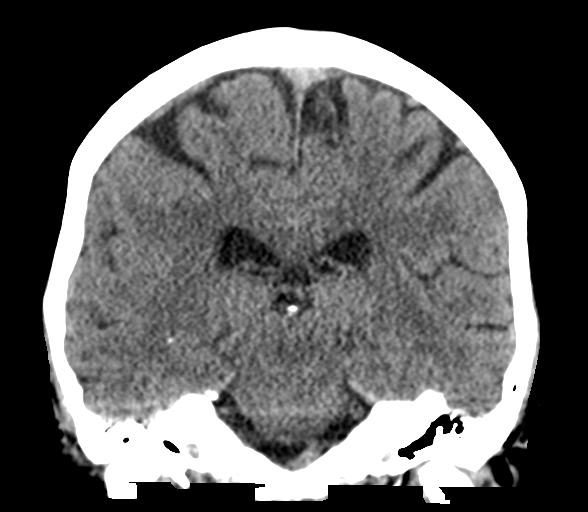

[Series 5: sagittal soft tissue · sagittal · 0.29mm/px · 3 of 56 slices shown]
[im 19/56  brain]
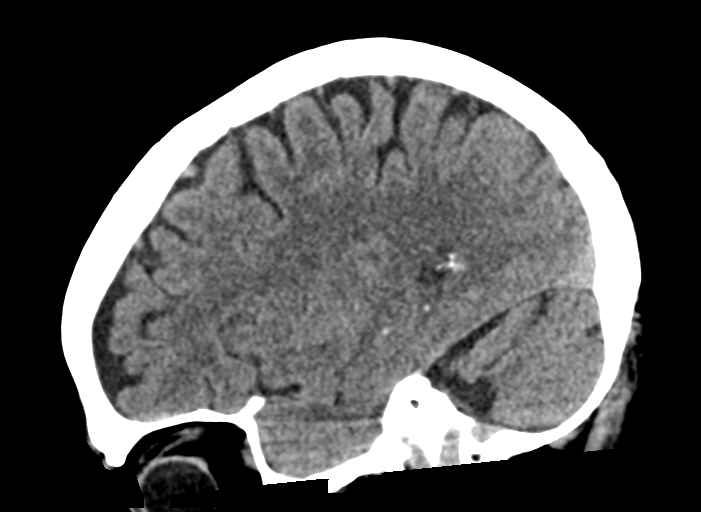
[im 28/56  brain]
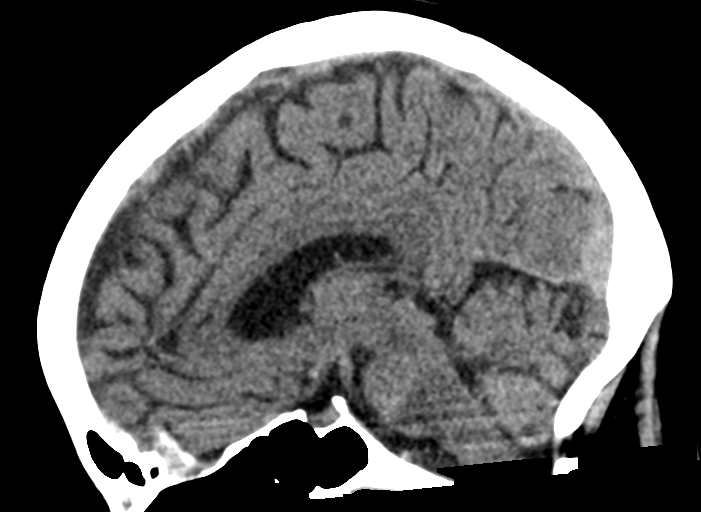
[im 37/56  brain]
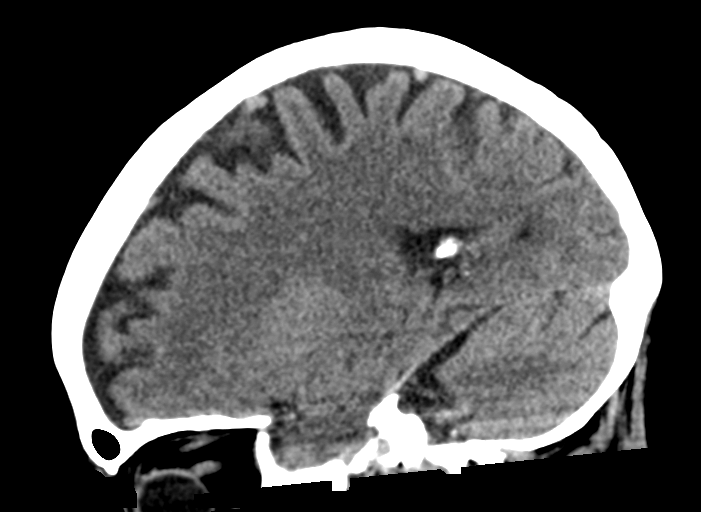

[15 of 45 positions shown; findings below may reference images not displayed]

FINDINGS: Brain: No evidence of acute infarction, hemorrhage, hydrocephalus,
extra-axial collection or mass lesion/mass effect.

Vascular: No hyperdense vessel or unexpected calcification.

Skull: Normal. Negative for fracture or focal lesion.

Sinuses/Orbits: Visualized globes and orbits are unremarkable.
Visualized sinuses are clear.

Other: None.
IMPRESSION: Normal unenhanced CT scan of the brain.

## 2022-08-07 DIAGNOSIS — J449 Chronic obstructive pulmonary disease, unspecified: Secondary | ICD-10-CM | POA: Insufficient documentation

## 2022-08-07 DIAGNOSIS — M25511 Pain in right shoulder: Secondary | ICD-10-CM

## 2022-08-07 HISTORY — DX: Pain in right shoulder: M25.511

## 2022-08-24 DIAGNOSIS — Z419 Encounter for procedure for purposes other than remedying health state, unspecified: Secondary | ICD-10-CM | POA: Diagnosis not present

## 2022-09-11 ENCOUNTER — Telehealth: Payer: Self-pay

## 2022-09-11 NOTE — Telephone Encounter (Signed)
LMTCB to schedule PCP apt. AS, CMA 

## 2022-09-20 IMAGING — CT CT CHEST W/O CM
1 series · 15 of 34 positions shown, 19 images · non-contrast
Comparison: 02/20/2021

CLINICAL DATA: Follow-up lung nodule



[Series 2: thorax · axial · 0.67mm/px · z∈[-658,-368]mm · 15 of 171 slices shown, 19 images]
[im 13/171  mediastinal]
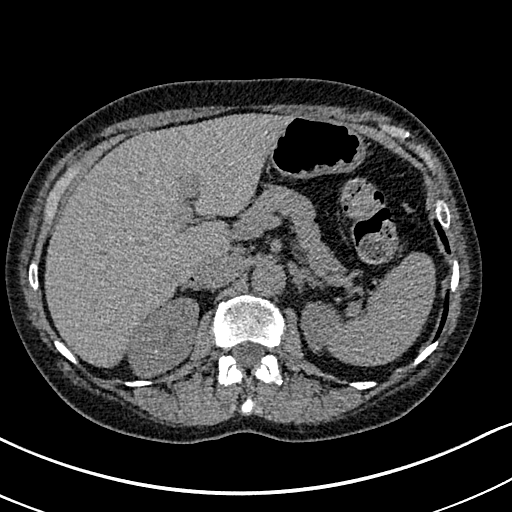
[im 13/171  lung]
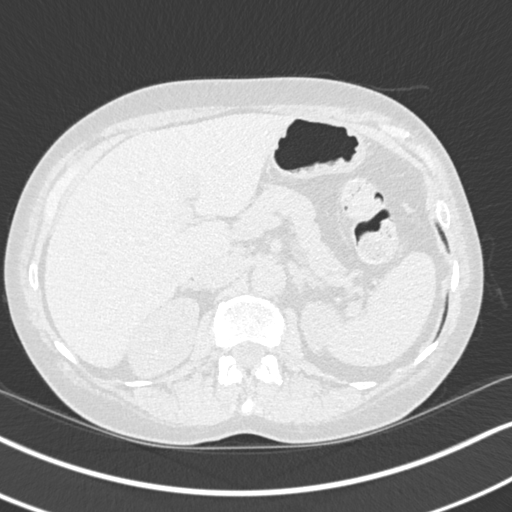
[im 26/171  lung]
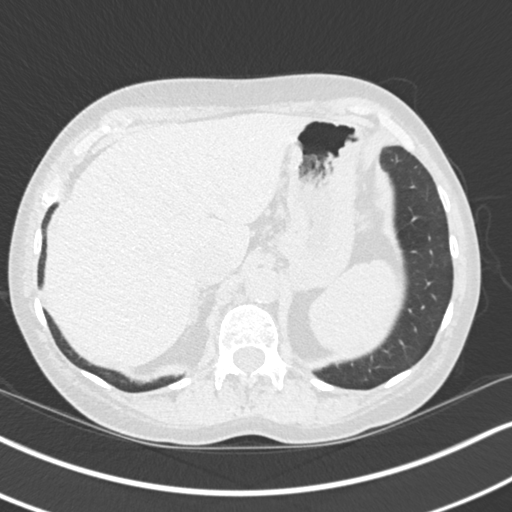
[im 35/171  lung]
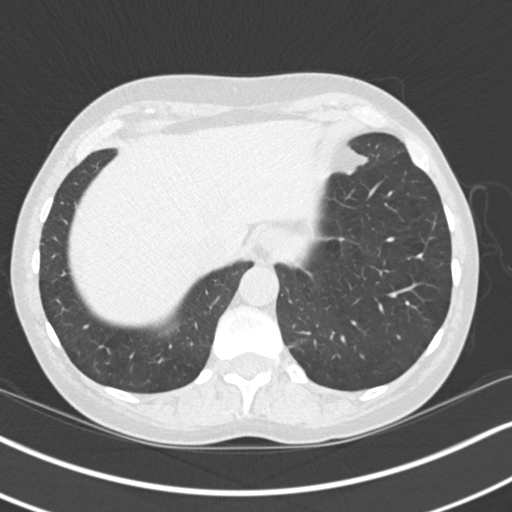
[im 45/171  lung]
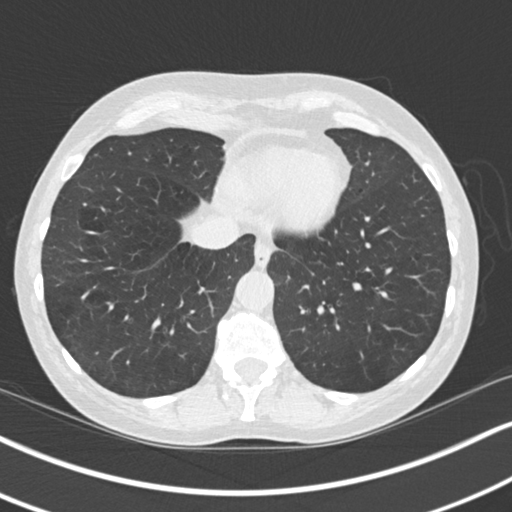
[im 57/171  mediastinal]
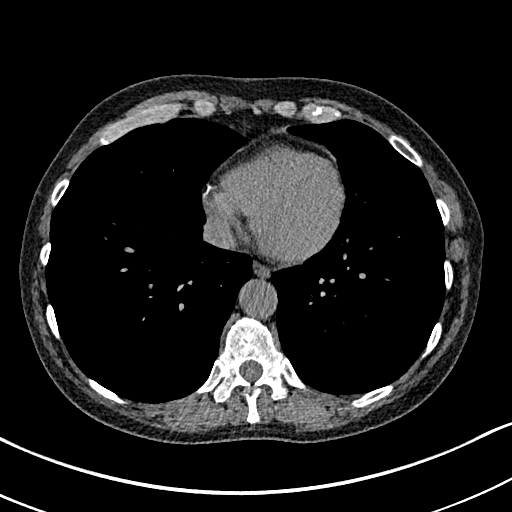
[im 57/171  lung]
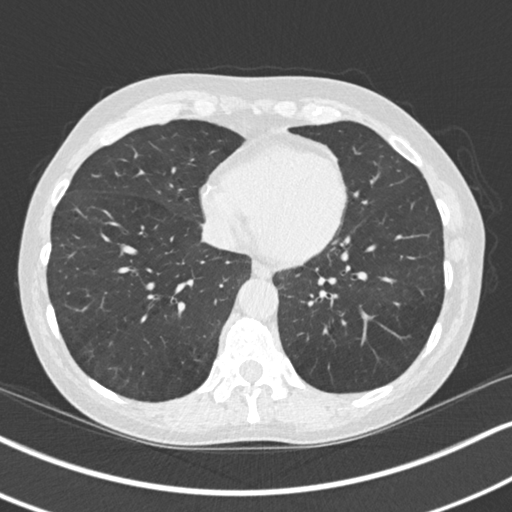
[im 69/171  lung]
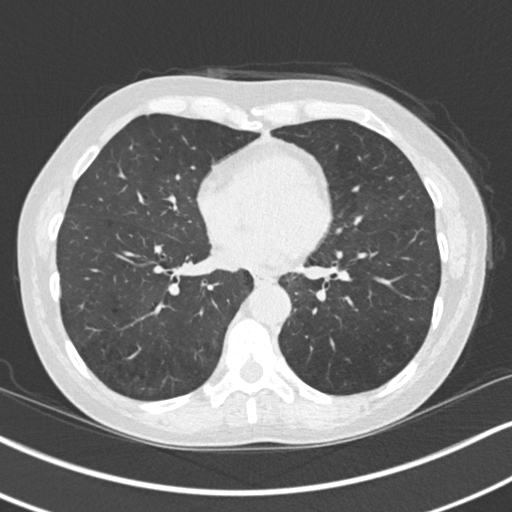
[im 76/171  lung]
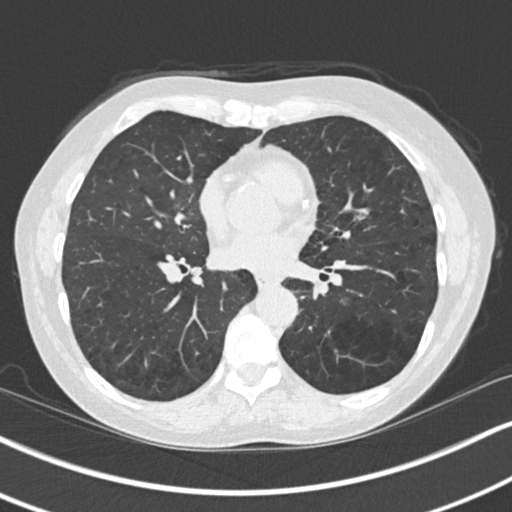
[im 89/171  lung]
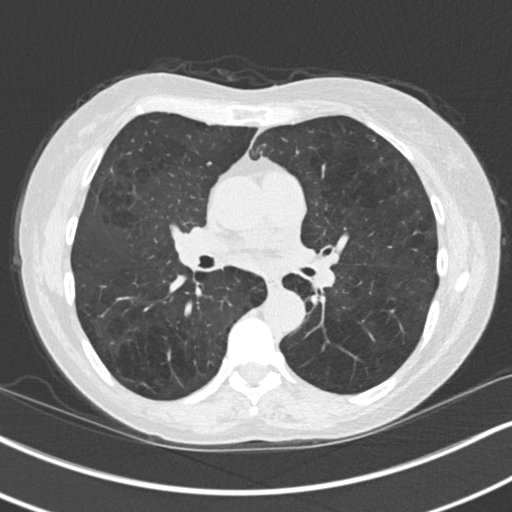
[im 95/171  mediastinal]
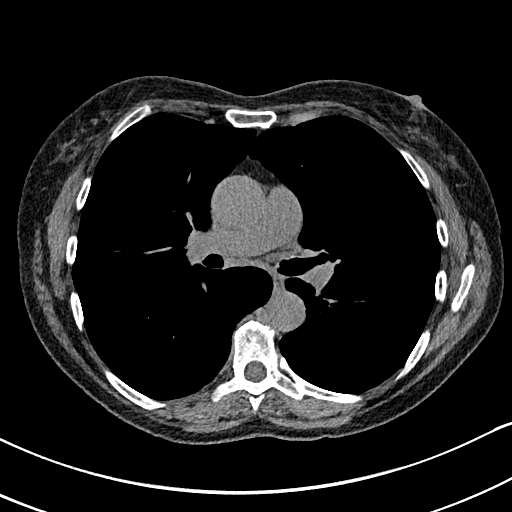
[im 95/171  lung]
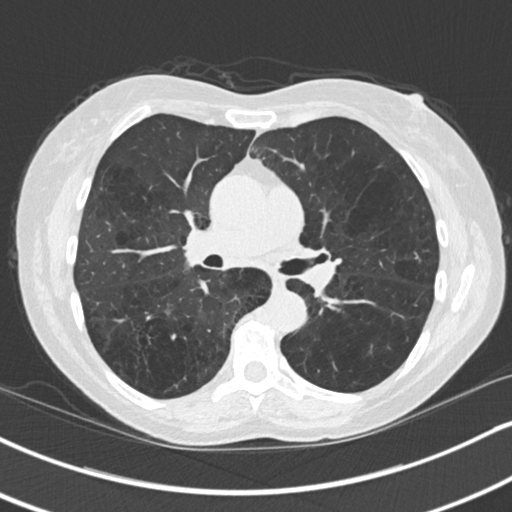
[im 103/171  lung]
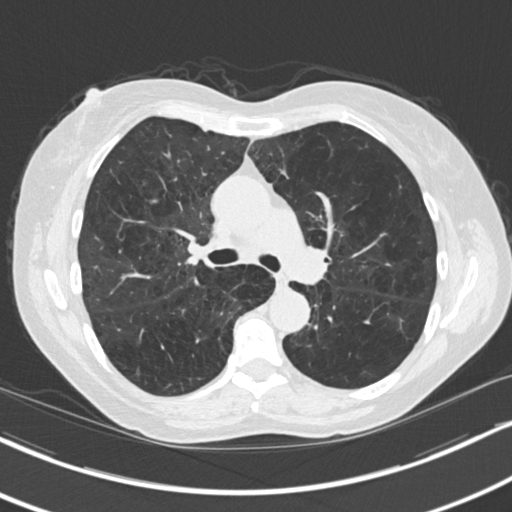
[im 114/171  lung]
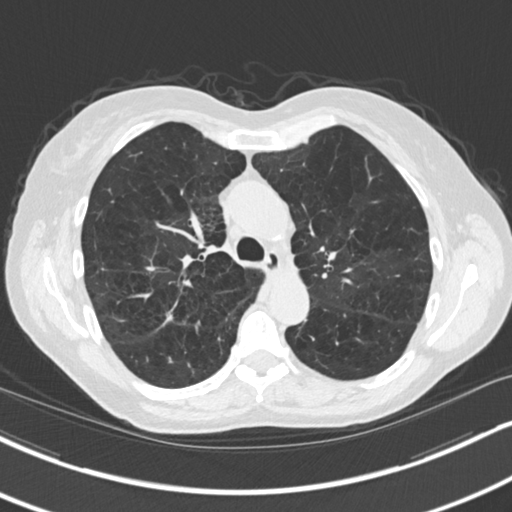
[im 126/171  lung]
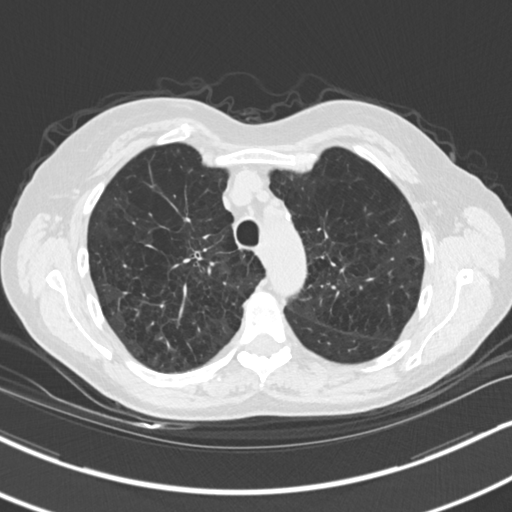
[im 137/171  mediastinal]
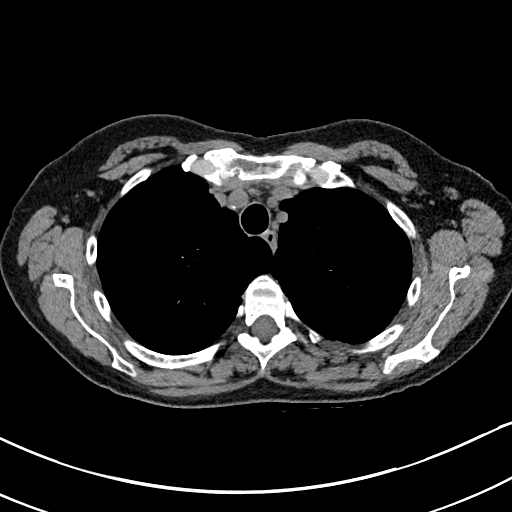
[im 137/171  lung]
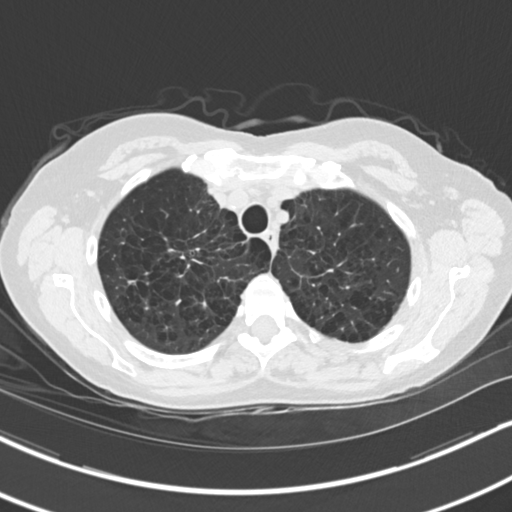
[im 145/171  lung]
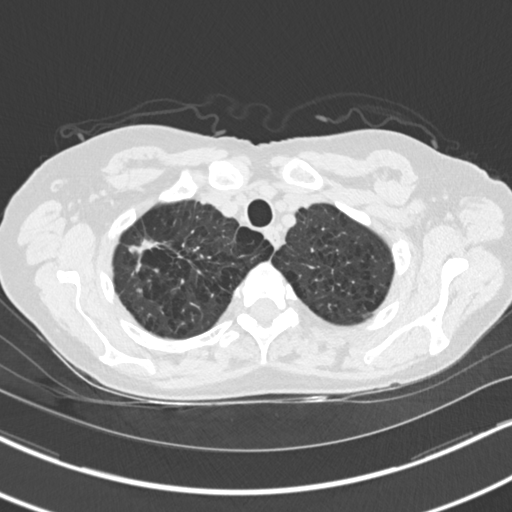
[im 158/171  lung]
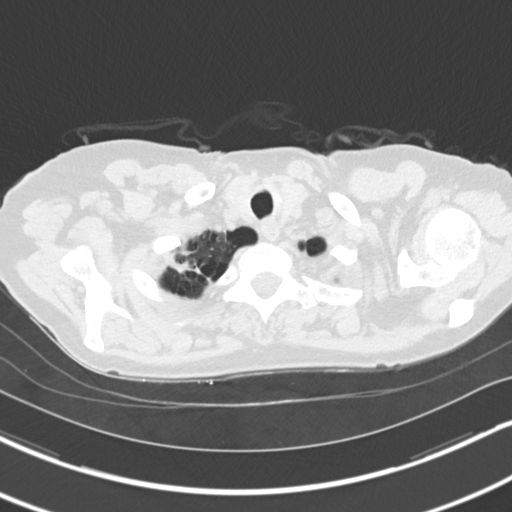

[15 of 34 positions shown; findings below may reference images not displayed]

FINDINGS: Cardiovascular: Heart size normal. No pericardial effusion. Aortic
and coronary artery atherosclerotic calcifications noted.

Mediastinum/Nodes: No enlarged mediastinal or axillary lymph nodes.
Thyroid gland, trachea, and esophagus demonstrate no significant
findings.

Lungs/Pleura: Moderate to severe changes of paraseptal and
centrilobular emphysema. Diffuse bronchial wall thickening noted. No
pleural effusion, airspace consolidation, or pneumothorax. Scarring
and architectural distortion is identified within the right upper
lobe/apex, image [DATE] and image [DATE]. The central nodular density
within this area identified on the previous exam is again noted and
appears stable in the interval measuring 7 mm, image [DATE].

Upper Abdomen: No acute findings. 2 mm stone identified within upper
pole collecting system of the right kidney.

Musculoskeletal: No chest wall mass or suspicious bone lesions
identified.
IMPRESSION: 1. Stable appearance of right upper lobe/apex scarring and
architectural distortion. The central nodular density within this
area is stable measuring 7 mm. Future CT at 18-24 months (from
02/20/2021) is considered optional for low-risk patients, but is
recommended for high-risk patients. This recommendation follows the
consensus statement: Guidelines for management of Incidental
Pulmonary Nodules Detected on CT Images: from the [HOSPITAL]
2. Diffuse bronchial wall thickening with emphysema, as above;
imaging findings suggestive of underlying COPD.
3. Nonobstructing right renal calculus.
4. Aortic Atherosclerosis (WLSKM-NOC.C) and Emphysema (WLSKM-G0F.7).

## 2022-09-24 DIAGNOSIS — Z419 Encounter for procedure for purposes other than remedying health state, unspecified: Secondary | ICD-10-CM | POA: Diagnosis not present

## 2022-10-25 DIAGNOSIS — Z419 Encounter for procedure for purposes other than remedying health state, unspecified: Secondary | ICD-10-CM | POA: Diagnosis not present

## 2022-11-15 DIAGNOSIS — J069 Acute upper respiratory infection, unspecified: Secondary | ICD-10-CM | POA: Diagnosis not present

## 2022-11-23 DIAGNOSIS — Z419 Encounter for procedure for purposes other than remedying health state, unspecified: Secondary | ICD-10-CM | POA: Diagnosis not present

## 2022-12-24 DIAGNOSIS — Z419 Encounter for procedure for purposes other than remedying health state, unspecified: Secondary | ICD-10-CM | POA: Diagnosis not present

## 2023-01-23 DIAGNOSIS — Z419 Encounter for procedure for purposes other than remedying health state, unspecified: Secondary | ICD-10-CM | POA: Diagnosis not present

## 2023-02-19 DIAGNOSIS — Z1389 Encounter for screening for other disorder: Secondary | ICD-10-CM | POA: Diagnosis not present

## 2023-02-19 DIAGNOSIS — J019 Acute sinusitis, unspecified: Secondary | ICD-10-CM | POA: Diagnosis not present

## 2023-02-19 DIAGNOSIS — H6690 Otitis media, unspecified, unspecified ear: Secondary | ICD-10-CM | POA: Diagnosis not present

## 2023-02-23 DIAGNOSIS — Z419 Encounter for procedure for purposes other than remedying health state, unspecified: Secondary | ICD-10-CM | POA: Diagnosis not present

## 2023-03-25 DIAGNOSIS — Z419 Encounter for procedure for purposes other than remedying health state, unspecified: Secondary | ICD-10-CM | POA: Diagnosis not present

## 2023-04-03 ENCOUNTER — Telehealth: Payer: Self-pay

## 2023-04-03 NOTE — Telephone Encounter (Signed)
Chart review completed for patient. Patient is due for cervical cancer screen. Mychart message sent to patient to inquire about scheduling.  Elijio Miles, Population Health Specialist.

## 2023-04-25 DIAGNOSIS — Z419 Encounter for procedure for purposes other than remedying health state, unspecified: Secondary | ICD-10-CM | POA: Diagnosis not present

## 2023-05-16 ENCOUNTER — Ambulatory Visit
Admission: RE | Admit: 2023-05-16 | Discharge: 2023-05-16 | Disposition: A | Discharge status: Home / Self Care | Payer: Medicaid Other | Source: Ambulatory Visit | Attending: Nurse Practitioner | Admitting: Nurse Practitioner

## 2023-05-16 ENCOUNTER — Ambulatory Visit: Payer: Medicaid Other

## 2023-05-16 DIAGNOSIS — R911 Solitary pulmonary nodule: Secondary | ICD-10-CM | POA: Diagnosis not present

## 2023-05-16 DIAGNOSIS — J439 Emphysema, unspecified: Secondary | ICD-10-CM | POA: Diagnosis not present

## 2023-05-20 ENCOUNTER — Encounter: Payer: Self-pay | Admitting: Nurse Practitioner

## 2023-05-20 ENCOUNTER — Inpatient Hospital Stay: Payer: Medicaid Other | Attending: Nurse Practitioner | Admitting: Nurse Practitioner

## 2023-05-20 DIAGNOSIS — R911 Solitary pulmonary nodule: Secondary | ICD-10-CM

## 2023-05-20 DIAGNOSIS — F17210 Nicotine dependence, cigarettes, uncomplicated: Secondary | ICD-10-CM | POA: Diagnosis not present

## 2023-05-20 NOTE — Patient Instructions (Signed)
Dear [name],   Congratulations for your interest in quitting smoking!  Find a program that suits you best: when you want to quit, how you need support, where you live, and how you like to learn.    If you're ready to get started TODAY, consider scheduling a visit through [email or web address].  Appointments are available from 8am to 8pm, every day of the year.   Most health insurance plans will cover some level of tobacco cessation visits and medications.    Additional Resources: [name] are also available to help you quit & provide the support you'll need. Many programs are available in both [place] and Spanish and have a long history of successfully helping people get off and stay off tobacco.    Quit Smoking Apps:  quitSTART at [employer].de QuitGuide?at [employer]getParking.dk Online education and resources: [name]  at [employer].gov Free Telephone Coaching: [name],  Call 1-800-QUIT-NOW (772-811-2955) or Text- Ready to 651-800-2853 *Quitline [place] has teamed up with Medicaid to offer a free 14 week program    Vaping- Want to Quit? Free 24/7 support. Call 1-800-QUIT-NOW

## 2023-05-20 NOTE — Progress Notes (Signed)
Pulmonary Nodule Clinic Consult note [facility]  [phone] [phone]  Patient Care Team: [facility] as PCP - General   Name of the patient: Vickie Hooper  [id number]  01/29/1961   Date of visit: 05/20/2023   Diagnosis- Lung Nodule  Virtual Visit via Telemedicine Note   I connected with Vickie Hooper on 05/20/23 at 1105 AM by video enabled telemedicine visit and verified that I am speaking with the correct person using two identifiers.   I discussed the limitations, risks, security and privacy concerns of performing an evaluation and management service by telemedicine and the availability of in-person appointments. I also discussed with the patient that there may be a patient responsible charge related to this service. The patient expressed understanding and agreed to proceed.   Other persons participating in the visit and their role in the encounter: none  Patient's location: home  Provider's location: clinic  Chief complaint/ Reason for visit- Pulmonary Nodule Clinic Follow up Visit  Past Medical History:  Patient is managed/referred by PCP.  patient presented as a 62 year old female who is referred to pulmonary nodule clinic for incidental finding of lung nodule.  Patient was undergoing work-up in ER for COVID infection and was found to have elevated D-dimer.  This prompted CT angio which showed incidental pulmonary nodule. She overall feels well and denies worsening cough, chest pain, hemoptysis, unintentional weight loss.  02/20/21- CT Angio - 7 mm nodule in the RUL 11/14/21- CT Chest WO contrast - stable at 7 mm   Interval history- Patient is 62 year old female with above history of lung nodule who agrees to evaluation via telemedicine for follow up of incidental lung nodule. She had CT on 8/222/24. She denies worsening cough, chest pain, unintentional weight loss, recurrent infections, or hemoptysis.   ECOG  FS:0 - Asymptomatic  Review of systems- Review of Systems  Constitutional:  Negative for chills, fever, malaise/fatigue and weight loss.  HENT:  Negative for hearing loss, nosebleeds, sore throat and tinnitus.   Eyes:  Negative for blurred vision and double vision.  Respiratory:  Negative for cough, hemoptysis, shortness of breath and wheezing.   Cardiovascular:  Negative for chest pain, palpitations and leg swelling.  Gastrointestinal:  Negative for abdominal pain, blood in stool, constipation, diarrhea, melena, nausea and vomiting.  Genitourinary:  Negative for dysuria and urgency.  Musculoskeletal:  Negative for back pain, falls, joint pain and myalgias.  Skin:  Negative for itching and rash.  Neurological:  Negative for dizziness, tingling, sensory change, loss of consciousness, weakness and headaches.  Endo/Heme/Allergies:  Negative for environmental allergies. Does not bruise/bleed easily.  Psychiatric/Behavioral:  Negative for depression. The patient is not nervous/anxious and does not have insomnia.      Allergies  Allergen Reactions   Penicillins    Past Medical History:  Diagnosis Date   Depression    Past Surgical History:  Procedure Laterality Date   CESAREAN SECTION  08/14/1989   Social History   Socioeconomic History   Marital status: Single    Spouse name: Not on file   Number of children: Not on file   Years of education: Not on file   Highest education level: Not on file  Occupational History   Not on file  Tobacco Use   Smoking status: Every Day    Types: Cigarettes   Smokeless tobacco: Never  Vaping Use   Vaping status: Never Used  Substance and Sexual Activity   Alcohol use:  Yes    Alcohol/week: 6.0 standard drinks of alcohol    Types: 6 Cans of beer per week    Comment: 6 beers a day   Drug use: Yes    Types: Marijuana   Sexual activity: Not on file  Other Topics Concern   Not on file  Social History Narrative   Not on file   Social  Determinants of Health   Financial Resource Strain: Not on file  Food Insecurity: Not on file  Transportation Needs: Not on file  Physical Activity: Not on file  Stress: Not on file  Social Connections: Not on file  Intimate Partner Violence: Not on file   Family History  Problem Relation Age of Onset   Esophageal cancer Mother    COPD Mother    Cancer Father    Current Outpatient Medications:    ANORO ELLIPTA 62.5-25 MCG/ACT AEPB, Inhale 1 puff into the lungs daily., Disp: , Rfl:    cetirizine (ZYRTEC) 10 MG tablet, Take 10 mg by mouth daily as needed., Disp: , Rfl:    fluticasone (FLONASE) 50 MCG/ACT nasal spray, Place 1 spray into both nostrils daily., Disp: , Rfl:   Physical exam: There were no vitals filed for this visit. Physical Exam Pulmonary:     Effort: No respiratory distress.     CT Chest Wo Contrast  Result Date: 05/20/2023 CLINICAL DATA:  Follow-up pulmonary nodule EXAM: CT CHEST WITHOUT CONTRAST TECHNIQUE: Multidetector CT imaging of the chest was performed following the standard protocol without IV contrast. RADIATION DOSE REDUCTION: This exam was performed according to the departmental dose-optimization program which includes automated exposure control, adjustment of the mA and/or kV according to patient size and/or use of iterative reconstruction technique. COMPARISON:  CT chest dated 11/14/2021 FINDINGS: Cardiovascular: The heart is normal in size. No pericardial effusion. No evidence of thoracic aneurysm. Mild atherosclerotic calcifications of the aortic arch. Mild coronary atherosclerosis of the LAD and right coronary artery. Mediastinum/Nodes: Small mediastinal nodes, including a dominant 10 mm short axis low right paratracheal node (series 2/image 60), similar. Visualized thyroid is unremarkable. Lungs/Pleura: Biapical pleural-parenchymal scarring. Mild linear scarring in the medial left lower lobe. Moderate centrilobular and paraseptal emphysematous changes, upper  lung predominant. No focal consolidation. Scattered right upper lobe peribronchovascular nodules measuring up to 5 mm (series 3/image 66), new, favoring mild infection/inflammation. Right upper lobe scarring versus mural nodularity (series 3/image 26), favored to be mildly progressive, measuring 10 mm when compared to priors (previously 7 mm). No pleural effusion or pneumothorax. Upper Abdomen: Visualized upper abdomen is notable for a 2 mm nonobstructing right upper pole renal calculus and vascular calcifications. Musculoskeletal: Mild degenerative changes of the visualized thoracolumbar spine. IMPRESSION: Right upper lobe scarring versus mural nodularity, favored to be mildly progressive, as described above. Follow-up CT chest is suggested in 6 months. Scattered right upper lobe peribronchovascular nodules measuring up to 5 mm, new, favoring mild infection/inflammation. Aortic Atherosclerosis (ICD10-I70.0) and Emphysema (ICD10-J43.9). Trauma Electronically Signed   By: Charline Bills M.D.   On: 05/20/2023 13:42     Assessment and plan- Patient is a 62 y.o. female   Lung nodule- incidental. First noted on 02/20/21 ct angio in RUL measuring 7 mm. She has several risk factors that make her high risk for development of lung cancer. Stable on 11/16/21 imaging. I independently reviewed CT Chest wo contrast from 05/16/23 which reveals some increase in nodule, now measuring 10 mm. No other new nodules and no lymphadenopathy. Plan to repeat CT  in 6 months. If imaging is stable, plan to refer to pulmonology for low dose ct screening. Will refer patient to pulmonology to follow her.  Emphysema/COPD- on anoro per pcp. Encouraged use and tobacco cessation.  Nicotine dependence- interested in quitting. Will refer to virtual smoking cessation clinic. She is unable to use mychart and prefers telephone or in person visits.   Disposition: 6 mo - ct chest w/ follow up virtually 2 weeks later- la  Visit Diagnosis 1.  Incidental lung nodule, greater than or equal to 8mm   2. Cigarette nicotine dependence without complication    I spent 20 minutes face-to-face video visit time dedicated to the care of this patient on the date of this encounter to include pre-visit review of ct chest, face-to-face time with the patient, and post visit ordering of testing/documentation.    Patient expressed understanding and was in agreement with this plan. She also understands that She can call clinic at any time with any questions, concerns, or complaints.   Thank you for allowing me to participate in the care of this patient.   Alinda Dooms, NP 05/20/2023

## 2023-05-21 ENCOUNTER — Telehealth: Payer: Medicaid Other | Admitting: Physician Assistant

## 2023-05-21 NOTE — Progress Notes (Signed)
The patient no-showed for appointment despite this provider sending direct link, reaching out via phone with no response and waiting for at least 10 minutes from appointment time for patient to join. They will be marked as a NS for this appointment/time.  ? ?William Cody Martin, PA-C ? ? ? ?

## 2023-05-26 DIAGNOSIS — Z419 Encounter for procedure for purposes other than remedying health state, unspecified: Secondary | ICD-10-CM | POA: Diagnosis not present

## 2023-06-25 DIAGNOSIS — J449 Chronic obstructive pulmonary disease, unspecified: Secondary | ICD-10-CM | POA: Diagnosis not present

## 2023-06-25 DIAGNOSIS — F17210 Nicotine dependence, cigarettes, uncomplicated: Secondary | ICD-10-CM | POA: Diagnosis not present

## 2023-06-25 DIAGNOSIS — E0590 Thyrotoxicosis, unspecified without thyrotoxic crisis or storm: Secondary | ICD-10-CM | POA: Diagnosis not present

## 2023-06-25 DIAGNOSIS — E875 Hyperkalemia: Secondary | ICD-10-CM | POA: Diagnosis not present

## 2023-06-25 DIAGNOSIS — Z1389 Encounter for screening for other disorder: Secondary | ICD-10-CM | POA: Diagnosis not present

## 2023-06-25 DIAGNOSIS — N6452 Nipple discharge: Secondary | ICD-10-CM

## 2023-06-25 DIAGNOSIS — M2540 Effusion, unspecified joint: Secondary | ICD-10-CM | POA: Diagnosis not present

## 2023-06-25 DIAGNOSIS — R531 Weakness: Secondary | ICD-10-CM | POA: Diagnosis not present

## 2023-06-25 DIAGNOSIS — Z419 Encounter for procedure for purposes other than remedying health state, unspecified: Secondary | ICD-10-CM | POA: Diagnosis not present

## 2023-06-25 HISTORY — DX: Weakness: R53.1

## 2023-06-25 HISTORY — DX: Nipple discharge: N64.52

## 2023-06-25 HISTORY — DX: Effusion, unspecified joint: M25.40

## 2023-06-27 ENCOUNTER — Encounter: Payer: Self-pay | Admitting: General Practice

## 2023-06-28 ENCOUNTER — Other Ambulatory Visit: Payer: Self-pay | Admitting: Nurse Practitioner

## 2023-06-28 DIAGNOSIS — N644 Mastodynia: Secondary | ICD-10-CM

## 2023-06-28 DIAGNOSIS — N6452 Nipple discharge: Secondary | ICD-10-CM

## 2023-07-01 ENCOUNTER — Encounter: Payer: Self-pay | Admitting: Nurse Practitioner

## 2023-07-01 ENCOUNTER — Other Ambulatory Visit: Payer: Self-pay | Admitting: Nurse Practitioner

## 2023-07-01 DIAGNOSIS — N6452 Nipple discharge: Secondary | ICD-10-CM

## 2023-07-01 DIAGNOSIS — N644 Mastodynia: Secondary | ICD-10-CM

## 2023-07-05 ENCOUNTER — Encounter: Payer: Self-pay | Admitting: Pulmonary Disease

## 2023-07-12 ENCOUNTER — Ambulatory Visit
Admission: RE | Admit: 2023-07-12 | Discharge: 2023-07-12 | Disposition: A | Discharge status: Home / Self Care | Payer: Medicaid Other | Source: Ambulatory Visit | Attending: Nurse Practitioner | Admitting: Nurse Practitioner

## 2023-07-12 DIAGNOSIS — N6452 Nipple discharge: Secondary | ICD-10-CM

## 2023-07-12 DIAGNOSIS — R92323 Mammographic fibroglandular density, bilateral breasts: Secondary | ICD-10-CM | POA: Diagnosis not present

## 2023-07-12 DIAGNOSIS — N644 Mastodynia: Secondary | ICD-10-CM | POA: Diagnosis not present

## 2023-07-12 DIAGNOSIS — N6320 Unspecified lump in the left breast, unspecified quadrant: Secondary | ICD-10-CM | POA: Diagnosis not present

## 2023-07-22 ENCOUNTER — Encounter: Payer: Self-pay | Admitting: Student in an Organized Health Care Education/Training Program

## 2023-07-22 ENCOUNTER — Ambulatory Visit (INDEPENDENT_AMBULATORY_CARE_PROVIDER_SITE_OTHER): Payer: Medicaid Other | Admitting: Student in an Organized Health Care Education/Training Program

## 2023-07-22 VITALS — BP 116/80 | HR 79 | Temp 97.6°F | Ht 67.0 in | Wt 140.6 lb

## 2023-07-22 DIAGNOSIS — F17200 Nicotine dependence, unspecified, uncomplicated: Secondary | ICD-10-CM | POA: Diagnosis not present

## 2023-07-22 DIAGNOSIS — J432 Centrilobular emphysema: Secondary | ICD-10-CM

## 2023-07-22 DIAGNOSIS — R0602 Shortness of breath: Secondary | ICD-10-CM

## 2023-07-22 DIAGNOSIS — R911 Solitary pulmonary nodule: Secondary | ICD-10-CM | POA: Diagnosis not present

## 2023-07-22 NOTE — Progress Notes (Signed)
Synopsis: Referred in for COPD by Dudley Major, FNP  Assessment & Plan:   1. Shortness of breath 2. Centrilobular emphysema (HCC)  Presenting for the evaluation of exertional dyspnea as well as non-productive cough. She has severe emphysema on imaging and is maintained on Anoro Ellipta for her symptoms, with some improvement. Counseled the patient extensively on the importance of smoking cessation. Will also obtain PFT's with spirometry, lung volumes, and DLCO. We also discussed pulmonary rehabilitation and she is willing to participate. Should her symptoms persist and she quit smoking, she could be a candidate for bronchoscopic lung volume reduction. No eosinophilia on review of her historic CBC's, but will consider adding ICS to her regimen pending PFT's.  - Pulmonary Function Test [facility] Only; Future - AMB referral to pulmonary rehabilitation - Continue Anoro Ellipta once daily.  3. Tobacco use disorder [F17.200]  Counseled the patient extensively on the importance of smoking cessation. She is contemplating quitting.  4. Lung Nodule  Noted to have sub-centimeter pulmonary nodules in the RUL. A follow up CT of the chest is ordered for February of 2025. I will see her for follow up after her repeat CT and review images alongside her PFT's   Return in about 4 months (around 11/23/2023).  I spent 60 minutes caring for this patient today, including preparing to see the patient, obtaining a medical history , reviewing a separately obtained history, performing a medically appropriate examination and/or evaluation, counseling and educating the patient/family/caregiver, ordering medications, tests, or procedures, documenting clinical information in the electronic health record, and independently interpreting results (not separately reported/billed) and communicating results to the patient/family/caregiver  Raechel Chute, MD [facility] 07/22/2023 11:33 AM    End of visit  medications:  No orders of the defined types were placed in this encounter.    Current Outpatient Medications:    ANORO ELLIPTA 62.5-25 MCG/ACT AEPB, Inhale 1 puff into the lungs daily., Disp: , Rfl:    cetirizine (ZYRTEC) 10 MG tablet, Take 10 mg by mouth daily as needed., Disp: , Rfl:    Subjective:   PATIENT ID: Vickie Hooper GENDER: female DOB: 1961/05/23, MRN: [id number]  Chief Complaint  Patient presents with   Consult    Occasional cough. Shortness of breath on exertion and wheezing at night.     HPI  Vickie Hooper is a pleasant 62 year old female presenting to clinic for the evaluation of shortness of breath.  She is complaining of exertional dyspnea, with a dry and non-productive cough. The dyspnea is mostly with exertion, and she doesn't experience it at rest. It is evident walking long distances and especially going up a flight of stairs. The cough is mostly at night, and quickly resolves. It doesn't wake her up from sleep. She does feel some fatigue at work which is contributing to her frustration with her symptoms. Denies any sputum production, hemoptysis, chest pain, chest tightness, or wheezing. She reports seasonal allergies for which she is prescribed zyrtec. She is prescribed Anoro Ellipta for her symptoms which she's used with improvement for over a year now. CT chest from February of 2023 and August 2024 reviewed and notable for emphysema as well as a tiny pulmonary nodule. She is referred to discuss both findings.  Patient currently works at [employer] as a Conservation officer, nature. She has no occupational exposures. She smokes between 1 and 1.5 packs per day, and has smoked since the age of 40. She reports history of smoking crack cocaine for 12 years,  quit 16 years ago.  Ancillary information including prior medications, full medical/surgical/family/social histories, and PFTs (when available) are listed below and have been reviewed.   Review of Systems  Constitutional:  Positive for  malaise/fatigue. Negative for chills, diaphoresis, fever and weight loss.  Respiratory:  Positive for cough and shortness of breath. Negative for hemoptysis, sputum production and wheezing.   Cardiovascular:  Negative for chest pain, palpitations and leg swelling.     Objective:   Vitals:   07/22/23 1046  BP: 116/80  Pulse: 79  Temp: 97.6 F (36.4 C)  TempSrc: Temporal  SpO2: 96%  Weight: 140 lb 9.6 oz (63.8 kg)  Height: 5\' 7"  (1.702 m)   96% on RA BMI Readings from Last 3 Encounters:  07/22/23 22.02 kg/m  02/20/21 20.36 kg/m  11/05/20 21.93 kg/m   Wt Readings from Last 3 Encounters:  07/22/23 140 lb 9.6 oz (63.8 kg)  02/20/21 130 lb (59 kg)  11/05/20 140 lb (63.5 kg)    Physical Exam Constitutional:      Appearance: Normal appearance.  Cardiovascular:     Rate and Rhythm: Normal rate and regular rhythm.     Pulses: Normal pulses.     Heart sounds: Normal heart sounds.  Pulmonary:     Effort: Pulmonary effort is normal.     Breath sounds: No wheezing, rhonchi or rales.     Comments: Decreased breath sounds bilaterally Neurological:     General: No focal deficit present.     Mental Status: She is alert and oriented to person, place, and time. Mental status is at baseline.     Ancillary Information    Past Medical History:  Diagnosis Date   Depression      Family History  Problem Relation Age of Onset   Esophageal cancer Mother    COPD Mother    Cancer Father      Past Surgical History:  Procedure Laterality Date   CESAREAN SECTION  08/14/1989    Social History   Socioeconomic History   Marital status: Single    Spouse name: Not on file   Number of children: Not on file   Years of education: Not on file   Highest education level: Not on file  Occupational History   Not on file  Tobacco Use   Smoking status: Every Day    Current packs/day: 1.00    Average packs/day: 1 pack/day for 48.8 years (48.8 ttl pk-yrs)    Types: Cigarettes     Start date: 1976   Smokeless tobacco: Never  Vaping Use   Vaping status: Never Used  Substance and Sexual Activity   Alcohol use: Yes    Alcohol/week: 6.0 standard drinks of alcohol    Types: 6 Cans of beer per week    Comment: 6 beers a day   Drug use: Yes    Types: Marijuana   Sexual activity: Not on file  Other Topics Concern   Not on file  Social History Narrative   Not on file   Social Determinants of Health   Financial Resource Strain: Not on file  Food Insecurity: Not on file  Transportation Needs: Not on file  Physical Activity: Not on file  Stress: Not on file  Social Connections: Not on file  Intimate Partner Violence: Not on file     Allergies  Allergen Reactions   Penicillins      CBC    Component Value Date/Time   WBC 8.3 02/20/2021 1325   RBC  4.30 02/20/2021 1325   HGB 14.1 02/20/2021 1325   HCT 41.4 02/20/2021 1325   PLT 151 02/20/2021 1325   MCV 96.3 02/20/2021 1325   MCH 32.8 02/20/2021 1325   MCHC 34.1 02/20/2021 1325   RDW 13.3 02/20/2021 1325    Pulmonary Functions Testing Results:     No data to display          Outpatient Medications Prior to Visit  Medication Sig Dispense Refill   ANORO ELLIPTA 62.5-25 MCG/ACT AEPB Inhale 1 puff into the lungs daily.     cetirizine (ZYRTEC) 10 MG tablet Take 10 mg by mouth daily as needed.     fluticasone (FLONASE) 50 MCG/ACT nasal spray Place 1 spray into both nostrils daily. (Patient not taking: Reported on 07/22/2023)     No facility-administered medications prior to visit.

## 2023-07-22 NOTE — Patient Instructions (Signed)
The [facility] Quitline: Call 1-800-QUIT-NOW 575-707-4353). The [facility] Quitline is a free service for [employer]. Trained counselors are available from 8 am until 3 am, 365 days per year. Services are available in both [place] and [place].   Web Resources Free online support programs can help you track your progress and share experiences with others who are quitting. These are examples: www.becomeanex.org www.trytostop.org  www.smokefree.gov  www.[employer].com.br.aspx  [facility]: offers comprehensive in-person tobacco treatment counseling at [facility] ([address], [place] [address]).  Open to everyone. Virtual appointments available. Free parking. Call [phone] to schedule an appointment or [phone] for general information.    Tobacco Cessation Medications  Nicotine Replacement Therapy (NRT)  Nicotine is the addictive part of tobacco smoke, but not the most dangerous part. There are 7000 other toxins in cigarettes, including carbon monoxide, that cause disease. People do not generally become addicted to medication. Common problems: People don't use enough medication or stop too early. Medications are safe and effective. Overdose is very uncommon. Use medications as long as needed (3 months minimum). Some combinations work better than single medications. Long acting medications like the NRT patch and bupropion provide continuous treatment for withdrawal symptoms.  PLUS  Short acting medications like the NRT gum, lozenge, inhaler, and nasal spray help people to cope with breakthrough cravings.  ? Nicotine Patch  Place patch on hairless skin on upper body, including arms and back. Each day: discard old patch, shower, apply new patch to a different site. Apply hydrocortisone cream to mildly red/irritated areas. Call provider if rash develops. If patch causes sleep disturbance, remove patch  at bedtime and replace each morning after shower. Side effects may include: skin irritation, headache, insomnia, abnormal/vivid dreams.  ? Nicotine Gum  Chew gum slowly, park in cheek when peppery taste or tingling sensation begins (about 15-30 chews). When taste or tingling goes away, begin chewing again. Use until nicotine is gone (taste or tingle does not return, usually 30 minutes). Park in different areas of mouth. Nicotine is absorbed through the lining of the mouth. Use enough to control cravings, up to 24 pieces per day (if used alone). Avoid eating or drinking for 15 minutes before using and during use. Side effects may include: mouth/jaw soreness, hiccups, indigestion, hypersalivation.  If gum is not chewed correctly, additional side effects may include lightheadedness, nausea/vomiting, throat and mouth irritation.  ? Nicotine Lozenge  Allow to dissolve slowly in mouth (20-30 minutes). Do not chew or swallow. Nicotine release may cause a warm tingling sensation. Occasionally rotate to different areas of the mouth. Use enough to control cravings, up to 20 lozenges per day (if used alone). Avoid eating or drinking for 15 minutes before using and during use. Side effects may include: nausea, hiccups, cough, heartburn, headache, gas, insomnia.  ? Nicotine Nasal Spray Use 1 spray in each nostril (1 dose) and tilt head back for 1 minute. Do not sniff, swallow, or inhale through nose.  Use at least 8 doses (1 spray in each nostril) , up to 40 doses per day (if used alone). To reduce nasal irritation, spray on cotton swab and insert into nose. Side effects may include: nasal and/or throat irritation (hot, peppery, or burning sensation), nasal irritation, tearing, sneezing, cough, headache.  ? Nicotine Oral Inhaler (puffer) Inhale into the back of the throat or puff in short breaths. Do not inhale into the lungs.  Puff continuously for 20 minutes (about 80 puffs) until cartridge  is  empty. Change cartridge when it loses the "burning in throat" sensation (feels like air only). Open cartridges can be saved and used again within 24 hours. Use at least 6 and up to 16 cartridges per day (if used alone).  Avoid eating or drinking for 15 minutes before using and during use. Side effects may include: mouth and/or throat irritation, unpleasant taste, cough, nasal irritation, indigestion, hiccups, headache.  ? Chantix (varenicline) Days 1-3: Take one 0.5 mg white pill each morning for 3 days, one week before quit date. Days 4-7: Increase to one 0.5 mg white pill twice a day in morning and evening for 4 days.  On Day 8 (target quit date), increase to one 1 mg blue pill twice a day. Maintain this dose for a minimum of 3 months. Take with food and a full glass of water to reduce nausea. Be sure that the two doses are at least 8 hours apart, but try to take second dose early in the evening (i.e. 6 pm) to avoid sleep problems. Common side effects include: nausea, insomnia, headache, abnormal/vivid dreams. Tell your doctor if you have any history of psychiatric illness prior to starting Chantix.  STOP taking CHANTIX and contact a healthcare provider immediately if you experience agitation, hostility, depressed mood, changes in thoughts or behavior that are not typical for you, thinking about or attempting suicide, allergic or skin reactions including swelling, rash, redness, or peeling of the skin.  For patients who have heart disease: Smoking is a major risk factor for cardiovascular disease, and Chantix can help you quit smoking. Chantix may be associated with a small, increased risk of certain heart events in patients who have heart disease. If you have any new or worsening symptoms of heart disease while taking Chantix, such as shortness of breath or trouble breathing, new or worsening chest pain, or new or worsening pain in your legs when walking, call your doctor or get emergency medical  help immediately.  ? Wellbutrin / Zyban (bupropion) Take one 150 mg pill each morning for 3 days, one week before target quit date. On Day 4, increase to one 150 mg pill twice a day, morning and evening.  Maintain this dose for a minimum of 3 months. Be sure that the two doses are at least 8 hours apart, but try to take second dose early in the evening (i.e. 6 pm) to avoid sleep problems. Avoid or minimize use of alcohol when taking this medication. Common side effects include: dry mouth, headache, insomnia, nausea, weight loss.  Risk of seizure is 09/998. STOP taking BUPROPION and contact a healthcare provider immediately if you experience agitation, hostility, depressed mood, changes in thoughts or behavior that are not typical for you, thinking about or attempting suicide, allergic or skin reactions including swelling, rash, redness, or peeling of the skin.

## 2023-07-26 DIAGNOSIS — Z419 Encounter for procedure for purposes other than remedying health state, unspecified: Secondary | ICD-10-CM | POA: Diagnosis not present

## 2023-07-29 DIAGNOSIS — Z1389 Encounter for screening for other disorder: Secondary | ICD-10-CM | POA: Diagnosis not present

## 2023-07-29 DIAGNOSIS — J449 Chronic obstructive pulmonary disease, unspecified: Secondary | ICD-10-CM | POA: Diagnosis not present

## 2023-07-29 DIAGNOSIS — Z0000 Encounter for general adult medical examination without abnormal findings: Secondary | ICD-10-CM | POA: Diagnosis not present

## 2023-07-29 DIAGNOSIS — Z23 Encounter for immunization: Secondary | ICD-10-CM | POA: Diagnosis not present

## 2023-07-29 DIAGNOSIS — F17210 Nicotine dependence, cigarettes, uncomplicated: Secondary | ICD-10-CM | POA: Diagnosis not present

## 2023-08-25 DIAGNOSIS — Z419 Encounter for procedure for purposes other than remedying health state, unspecified: Secondary | ICD-10-CM | POA: Diagnosis not present

## 2023-09-25 DIAGNOSIS — Z419 Encounter for procedure for purposes other than remedying health state, unspecified: Secondary | ICD-10-CM | POA: Diagnosis not present

## 2023-10-06 DIAGNOSIS — Z419 Encounter for procedure for purposes other than remedying health state, unspecified: Secondary | ICD-10-CM | POA: Diagnosis not present

## 2023-10-26 DIAGNOSIS — Z419 Encounter for procedure for purposes other than remedying health state, unspecified: Secondary | ICD-10-CM | POA: Diagnosis not present

## 2023-11-02 ENCOUNTER — Encounter: Payer: Self-pay | Admitting: Emergency Medicine

## 2023-11-02 ENCOUNTER — Ambulatory Visit
Admission: EM | Admit: 2023-11-02 | Discharge: 2023-11-02 | Disposition: A | Discharge status: Home / Self Care | Payer: Medicaid Other | Attending: Family Medicine | Admitting: Family Medicine

## 2023-11-02 DIAGNOSIS — N200 Calculus of kidney: Secondary | ICD-10-CM

## 2023-11-02 DIAGNOSIS — R350 Frequency of micturition: Secondary | ICD-10-CM | POA: Diagnosis not present

## 2023-11-02 DIAGNOSIS — R1030 Lower abdominal pain, unspecified: Secondary | ICD-10-CM

## 2023-11-02 LAB — URINALYSIS, W/ REFLEX TO CULTURE (INFECTION SUSPECTED)
Bilirubin Urine: NEGATIVE
Glucose, UA: NEGATIVE mg/dL
Ketones, ur: NEGATIVE mg/dL
Leukocytes,Ua: NEGATIVE
Nitrite: NEGATIVE
Protein, ur: NEGATIVE mg/dL
Specific Gravity, Urine: 1.01 (ref 1.005–1.030)
pH: 6 (ref 5.0–8.0)

## 2023-11-02 NOTE — Discharge Instructions (Addendum)
 I sent your urine for culture. Someone may call you to start antibiotics, if the culture is positive.  Go to the emergency department if pain gets worse, as I worry that you could be having a blocking kidney stone.

## 2023-11-02 NOTE — ED Provider Notes (Signed)
 [facility]    CSN: [id number] Arrival date & time: 11/02/23  1135      History   Chief Complaint Chief Complaint  Patient presents with   Dysuria     HPI HPI Vickie Hooper is a 63 y.o. female.    [name] presents for concern of bladder infection. Has achy bladder pain, urinary frequency, urinary urgency but dysuria or vaginal discharge. She has had several episodes of kidney stones of the past few months.  No fever or vomiting but is nauseous.   Tried Tylenol  and Naprosyn prior to arrival.  Has not had any antibiotics in last 30 days.   Denies known STI exposure. No LMP recorded. Patient is postmenopausal.          Past Medical History:  Diagnosis Date   Depression     There are no active problems to display for this patient.   Past Surgical History:  Procedure Laterality Date   CESAREAN SECTION  08/14/1989    OB History   No obstetric history on file.      Home Medications    Prior to Admission medications   Medication Sig Start Date End Date Taking? Authorizing Provider  ANORO ELLIPTA 62.5-25 MCG/ACT AEPB Inhale 1 puff into the lungs daily. 02/15/23  Yes [provider]  cetirizine (ZYRTEC) 10 MG tablet Take 10 mg by mouth daily as needed. 06/20/20  Yes [provider]    Family History Family History  Problem Relation Age of Onset   Esophageal cancer Mother    COPD Mother    Cancer Father     Social History Social History   Tobacco Use   Smoking status: Every Day    Current packs/day: 1.00    Average packs/day: 1 pack/day for 49.1 years (49.1 ttl pk-yrs)    Types: Cigarettes    Start date: 1976   Smokeless tobacco: Never  Vaping Use   Vaping status: Never Used  Substance Use Topics   Alcohol use: Yes    Alcohol/week: 6.0 standard drinks of alcohol    Types: 6 Cans of beer per week    Comment: 6 beers a day   Drug use: Yes    Types: Marijuana     Allergies   Penicillins   Review of  Systems Review of Systems: :negative unless otherwise stated in HPI.      Physical Exam Triage Vital Signs ED Triage Vitals  Encounter Vitals Group     BP 11/02/23 1207 (!) 133/92     Systolic BP Percentile --      Diastolic BP Percentile --      Pulse Rate 11/02/23 1207 98     Resp 11/02/23 1207 16     Temp 11/02/23 1207 98.4 F (36.9 C)     Temp Source 11/02/23 1207 Oral     SpO2 11/02/23 1207 97 %     Weight 11/02/23 1207 140 lb 10.5 oz (63.8 kg)     Height 11/02/23 1207 5' 7 (1.702 m)     Head Circumference --      Peak Flow --      Pain Score 11/02/23 1206 7     Pain Loc --      Pain Education --      Exclude from Growth Chart --    No data found.  Updated Vital Signs BP (!) 133/92 (BP Location: Left Arm)   Pulse 98   Temp 98.4 F (36.9 C) (Oral)  Resp 16   Ht 5' 7 (1.702 m)   Wt 63.8 kg   SpO2 97%   BMI 22.03 kg/m   Visual Acuity Right Eye Distance:   Left Eye Distance:   Bilateral Distance:    Right Eye Near:   Left Eye Near:    Bilateral Near:     Physical Exam GEN: well appearing female in no acute distress  CVS: well perfused  RESP: speaking in full sentences without pause  ABD: soft, lower abdominal tenderness, non-distended, no palpable masses, no CVA tenderness      UC Treatments / Results  Labs (all labs ordered are listed, but only abnormal results are displayed) Labs Reviewed  URINALYSIS, W/ REFLEX TO CULTURE (INFECTION SUSPECTED) - Abnormal; Notable for the following components:      Result Value   Hgb urine dipstick TRACE (*)    Bacteria, UA RARE (*)    All other components within normal limits    EKG   Radiology No results found.  Procedures Procedures (including critical care time)  Medications Ordered in UC Medications - No data to display  Initial Impression / Assessment and Plan / UC Course  I have reviewed the triage vital signs and the nursing notes.  Pertinent labs & imaging results that were available  during my care of the patient were reviewed by me and considered in my medical decision making (see chart for details).       Acute cystitis:  Patient is a 63 y.o. female  who has history of recurrent kidney stones presents for 1day  of urinary frequency and urgency.  Overall patient is well-appearing and afebrile.  Vital signs stable.  Urinalysis is not consistent with acute cystitis.  Antibiotics deferred for now.  Hematuria is supported on microscopy.  Urine culture obtained.  Follow-up sensitivities and change antibiotics, if needed.  Discussed the possibility of her having a blocking kidney stone but she does not want to go to the emergency department for a scan at this time.  States that she has them frequently and will go home and drink plenty of water.  Strict ED precautions and return precautions including but not limited to abdominal pain, fever, chills, nausea, or vomiting given. Follow-up,  if symptoms not improving or getting worse. Discussed MDM, treatment plan and plan for follow-up with patientwho agrees with plan.        Final Clinical Impressions(s) / UC Diagnoses   Final diagnoses:  Lower abdominal pain  Urinary frequency  Recurrent kidney stones     Discharge Instructions      I sent your urine for culture. Someone may call you to start antibiotics, if the culture is positive.  Go to the emergency department if pain gets worse, as I worry that you could be having a blocking kidney stone.      ED Prescriptions   None    PDMP not reviewed this encounter.   Elbie Statzer, DO 11/02/23 1529

## 2023-11-02 NOTE — ED Triage Notes (Signed)
 Pt c/o lower abdominal pain, nausea, urinary frequency. Started 1 days ago. Denies lower back or fever. Pt states she has been having episodes of kidney stones over the last couple of months.

## 2023-11-06 DIAGNOSIS — Z419 Encounter for procedure for purposes other than remedying health state, unspecified: Secondary | ICD-10-CM | POA: Diagnosis not present

## 2023-11-16 ENCOUNTER — Emergency Department: Payer: Medicaid Other

## 2023-11-16 ENCOUNTER — Emergency Department
Admission: EM | Admit: 2023-11-16 | Discharge: 2023-11-16 | Disposition: A | Discharge status: Home / Self Care | Payer: Medicaid Other | Attending: Emergency Medicine | Admitting: Emergency Medicine

## 2023-11-16 ENCOUNTER — Other Ambulatory Visit: Payer: Self-pay

## 2023-11-16 DIAGNOSIS — N200 Calculus of kidney: Secondary | ICD-10-CM | POA: Diagnosis not present

## 2023-11-16 DIAGNOSIS — R1030 Lower abdominal pain, unspecified: Secondary | ICD-10-CM | POA: Diagnosis present

## 2023-11-16 DIAGNOSIS — K5730 Diverticulosis of large intestine without perforation or abscess without bleeding: Secondary | ICD-10-CM | POA: Diagnosis not present

## 2023-11-16 DIAGNOSIS — R109 Unspecified abdominal pain: Secondary | ICD-10-CM | POA: Diagnosis not present

## 2023-11-16 LAB — CBC
HCT: 40.1 % (ref 36.0–46.0)
Hemoglobin: 13.8 g/dL (ref 12.0–15.0)
MCH: 32.2 pg (ref 26.0–34.0)
MCHC: 34.4 g/dL (ref 30.0–36.0)
MCV: 93.5 fL (ref 80.0–100.0)
Platelets: 344 10*3/uL (ref 150–400)
RBC: 4.29 MIL/uL (ref 3.87–5.11)
RDW: 13.5 % (ref 11.5–15.5)
WBC: 10.2 10*3/uL (ref 4.0–10.5)
nRBC: 0 % (ref 0.0–0.2)

## 2023-11-16 LAB — COMPREHENSIVE METABOLIC PANEL
ALT: 13 U/L (ref 0–44)
AST: 17 U/L (ref 15–41)
Albumin: 4.1 g/dL (ref 3.5–5.0)
Alkaline Phosphatase: 61 U/L (ref 38–126)
Anion gap: 9 (ref 5–15)
BUN: <5 mg/dL — ABNORMAL LOW (ref 8–23)
CO2: 24 mmol/L (ref 22–32)
Calcium: 9 mg/dL (ref 8.9–10.3)
Chloride: 100 mmol/L (ref 98–111)
Creatinine, Ser: 0.57 mg/dL (ref 0.44–1.00)
GFR, Estimated: >60 mL/min (ref >60)
Glucose, Bld: 115 mg/dL — ABNORMAL HIGH (ref 70–99)
Potassium: 4.4 mmol/L (ref 3.5–5.1)
Sodium: 133 mmol/L — ABNORMAL LOW (ref 135–145)
Total Bilirubin: 0.6 mg/dL (ref 0.0–1.2)
Total Protein: 6.9 g/dL (ref 6.5–8.1)

## 2023-11-16 LAB — URINALYSIS, ROUTINE W REFLEX MICROSCOPIC: Bacteria, UA: NONE SEEN

## 2023-11-16 LAB — LIPASE, BLOOD: Lipase: 41 U/L (ref 11–51)

## 2023-11-16 MED ORDER — ONDANSETRON HCL 4 MG/2ML IJ SOLN
4.0000 mg | Freq: Once | INTRAMUSCULAR | Status: AC
  Administered 2023-11-16: 4 mg via INTRAVENOUS
  Filled 2023-11-16: qty 2

## 2023-11-16 MED ORDER — IOHEXOL 350 MG/ML SOLN
100.0000 mL | Freq: Once | INTRAVENOUS | Status: AC | PRN
  Administered 2023-11-16: 100 mL via INTRAVENOUS

## 2023-11-16 MED ORDER — OXYCODONE HCL 5 MG PO TABS
5.0000 mg | ORAL_TABLET | Freq: Four times a day (QID) | ORAL | 0 refills | Status: AC | PRN
Start: 2023-11-16 — End: 2023-11-19

## 2023-11-16 MED ORDER — FENTANYL CITRATE PF 50 MCG/ML IJ SOSY
50.0000 ug | PREFILLED_SYRINGE | Freq: Once | INTRAMUSCULAR | Status: AC
  Administered 2023-11-16: 50 ug via INTRAVENOUS
  Filled 2023-11-16: qty 1

## 2023-11-16 NOTE — Discharge Instructions (Addendum)
 Call the urologist to make a follow-up appointment.  Take ibuprofen 600mg  every 8 hours daily (as long as you are not on any other blood thinners or have kidney disease) for one week Take tylenol 1g every 8 hours daily for one week Oxycodone for break through pain- take miralax with it to prevent constipation.   Call urology to make appointment.   IMPRESSION: 1. Nonobstructive punctate right nephrolithiasis. 2. Colonic diverticulosis with no acute diverticulitis. 3.  Aortic Atherosclerosis (ICD10-I70.0).  Take oxycodone as prescribed. Do not drink alcohol, drive or participate in any other potentially dangerous activities while taking this medication as it may make you sleepy. Do not take this medication with any other sedating medications, either prescription or over-the-counter. If you were prescribed Percocet or Vicodin, do not take these with acetaminophen (Tylenol) as it is already contained within these medications.  This medication is an opiate (or narcotic) pain medication and can be habit forming. Use it as little as possible to achieve adequate pain control. Do not use or use it with extreme caution if you have a history of opiate abuse or dependence. If you are on a pain contract with your primary care doctor or a pain specialist, be sure to let them know you were prescribed this medication today from the Emergency Department. This medication is intended for your use only - do not give any to anyone else and keep it in a secure place where nobody else, especially children, have access to it.

## 2023-11-16 NOTE — ED Notes (Signed)
Post void Bladder scan = 0ml

## 2023-11-16 NOTE — ED Triage Notes (Signed)
 Pt c/o RLQ pain for a few weeks now with possible kidney stone. Pt is reporting urinating frequency.

## 2023-11-16 NOTE — ED Notes (Signed)
 Pt ambulatory to restroom

## 2023-11-16 NOTE — ED Provider Notes (Signed)
 [facility] Provider Note    Event Date/Time   First MD Initiated Contact with Patient 11/16/23 1218     (approximate)   History   Abdominal Pain   HPI  Vickie Hooper is a 63 y.o. female with history of kidney stones who reports having lower abdominal pain off and on for the past 2 weeks.  She reports having some increased urinary frequency associated with it.  Denies any chest pain, shortness of breath.  States that she feels like she is passing kidney stones.  The pain comes and goes.   Physical Exam   Triage Vital Signs: ED Triage Vitals [11/16/23 1212]  Encounter Vitals Group     BP 132/88     Systolic BP Percentile      Diastolic BP Percentile      Pulse Rate 91     Resp 16     Temp 98.1 F (36.7 C)     Temp Source Oral     SpO2 97 %     Weight 141 lb (64 kg)     Height 5\' 7"  (1.702 m)     Head Circumference      Peak Flow      Pain Score 8     Pain Loc      Pain Education      Exclude from Growth Chart     Most recent vital signs: Vitals:   11/16/23 1212  BP: 132/88  Pulse: 91  Resp: 16  Temp: 98.1 F (36.7 C)  SpO2: 97%     General: Awake, no distress.  CV:  Good peripheral perfusion.  Resp:  Normal effort.  Abd:  No distention.  Tender more suprapubically Other:     ED Results / Procedures / Treatments   Labs (all labs ordered are listed, but only abnormal results are displayed) Labs Reviewed  LIPASE, BLOOD  COMPREHENSIVE METABOLIC PANEL  CBC  URINALYSIS, ROUTINE W REFLEX MICROSCOPIC     RADIOLOGY I have reviewed the CT personally interpreted and no obvious kidney stone that is obstructed  PROCEDURES:  Critical Care performed: No  Procedures   MEDICATIONS ORDERED IN ED: Medications  fentaNYL (SUBLIMAZE) injection 50 mcg (50 mcg Intravenous Given 11/16/23 1326)  ondansetron (ZOFRAN) injection 4 mg (4 mg Intravenous Given 11/16/23 1326)  iohexol (OMNIPAQUE) 350 MG/ML injection 100 mL (100 mLs  Intravenous Contrast Given 11/16/23 1314)     IMPRESSION / MDM / ASSESSMENT AND PLAN / ED COURSE  I reviewed the triage vital signs and the nursing notes.   Patient's presentation is most consistent with acute presentation with potential threat to life or bodily function.   Differential includes kidney stone, appendicitis, diverticulitis or other acute pathology will proceed with CT imaging to further evaluate.  Patient given some IV fentanyl IV Zofran to help with symptoms  UA is orange in color but patient did report that she was taking Pyridium. CBC is reassuring.  CMP shows normal creatinine.  Lipase normal  IMPRESSION: 1. Nonobstructive punctate right nephrolithiasis. 2. Colonic diverticulosis with no acute diverticulitis. 3.  Aortic Atherosclerosis (ICD10-I70.0).  Discussed with patient that I do not see any obstructive kidney stones it is possible she has passed a small 1 and she could pass an additional small 1.  Did recommend her following up with urology for further evaluation discussed for frequent urination for this to be further evaluated.  Did discuss with patient that the CT scan did show enlarged bladder but patient  did get a bladder scan that showed 300 and was able to urinate a good amount and had another bladder scan that showed 0 therefore no signs of retention.  Discussed with patient and she was requesting some pain medications.  She already reports taking Tylenol, ibuprofen at home.  We discussed the risk for oxycodone and doing a short course in case his second kidney stone does try to move through.  She expressed understanding.  Recommend taking MiraLAX with it to help prevent constipation.   FINAL CLINICAL IMPRESSION(S) / ED DIAGNOSES   Final diagnoses:  Kidney stone     Rx / DC Orders   ED Discharge Orders          Ordered    oxyCODONE (ROXICODONE) 5 MG immediate release tablet  Every 6 hours PRN        11/16/23 1443             Note:  This  document was prepared using Dragon voice recognition software and may include unintentional dictation errors.   Concha Se, MD 11/16/23 [phone]

## 2023-11-17 LAB — URINE CULTURE: Culture: NO GROWTH

## 2023-11-20 ENCOUNTER — Ambulatory Visit
Admission: RE | Admit: 2023-11-20 | Discharge: 2023-11-20 | Disposition: A | Discharge status: Home / Self Care | Payer: Medicaid Other | Source: Ambulatory Visit | Attending: Nurse Practitioner | Admitting: Nurse Practitioner

## 2023-11-20 DIAGNOSIS — F17210 Nicotine dependence, cigarettes, uncomplicated: Secondary | ICD-10-CM

## 2023-11-20 DIAGNOSIS — I700 Atherosclerosis of aorta: Secondary | ICD-10-CM | POA: Diagnosis not present

## 2023-11-20 DIAGNOSIS — R911 Solitary pulmonary nodule: Secondary | ICD-10-CM

## 2023-11-20 DIAGNOSIS — J439 Emphysema, unspecified: Secondary | ICD-10-CM | POA: Diagnosis not present

## 2023-11-23 DIAGNOSIS — Z419 Encounter for procedure for purposes other than remedying health state, unspecified: Secondary | ICD-10-CM | POA: Diagnosis not present

## 2023-12-03 ENCOUNTER — Encounter: Payer: Self-pay | Admitting: Nurse Practitioner

## 2023-12-03 ENCOUNTER — Telehealth: Payer: Self-pay

## 2023-12-03 ENCOUNTER — Inpatient Hospital Stay: Payer: Medicaid Other | Attending: Nurse Practitioner | Admitting: Nurse Practitioner

## 2023-12-03 VITALS — BP 148/98 | HR 79 | Temp 97.1°F | Resp 16 | Wt 142.0 lb

## 2023-12-03 DIAGNOSIS — F1910 Other psychoactive substance abuse, uncomplicated: Secondary | ICD-10-CM | POA: Diagnosis not present

## 2023-12-03 DIAGNOSIS — F17219 Nicotine dependence, cigarettes, with unspecified nicotine-induced disorders: Secondary | ICD-10-CM | POA: Diagnosis not present

## 2023-12-03 DIAGNOSIS — J439 Emphysema, unspecified: Secondary | ICD-10-CM | POA: Diagnosis not present

## 2023-12-03 DIAGNOSIS — Z79899 Other long term (current) drug therapy: Secondary | ICD-10-CM | POA: Diagnosis not present

## 2023-12-03 DIAGNOSIS — R911 Solitary pulmonary nodule: Secondary | ICD-10-CM

## 2023-12-03 DIAGNOSIS — F1020 Alcohol dependence, uncomplicated: Secondary | ICD-10-CM | POA: Insufficient documentation

## 2023-12-03 DIAGNOSIS — F17210 Nicotine dependence, cigarettes, uncomplicated: Secondary | ICD-10-CM | POA: Insufficient documentation

## 2023-12-03 NOTE — Patient Instructions (Addendum)
 Dear Vickie Hooper,  I've sent a referral to Vickie Hooper, he's a social worker at our Substance Abuse Program and I've heard great things from patients. He can be reached at [phone] but his office should contact you. Please let me know if I can assist you in the future.   Congratulations for your interest in quitting smoking!  Find a program that suits you best: when you want to quit, how you need support, where you live, and how you like to learn.    If you're ready to get started TODAY, consider scheduling a visit through [name].  Appointments are available from 8am to 8pm, Monday to Friday.   Most health insurance plans will cover some level of tobacco cessation visits and medications.    Additional Resources: [name] are also available to help you quit & provide the support you'll need. Many programs are available in both [place] and Spanish and have a long history of successfully helping people get off and stay off tobacco.    Quit Smoking Apps:  quitSTART at [employer].de QuitGuide?at ForgetParking.dk Online education and resources: [name]  at [employer].gov Free Telephone Coaching: [name],  Call 1-800-QUIT-NOW ((740) 133-7874) or Text- Ready to (929)279-5938 *[name] has teamed up with Medicaid to offer a free 14 week program    Vaping- Want to Quit? Free 24/7 support. Call [facility]  [place], [place], [place], [place], [place]  [place]  Vickie Shames, NP

## 2023-12-03 NOTE — Telephone Encounter (Signed)
 Clinical Social Work was referred by medical provider for assessment of psychosocial needs.  CSW attempted to contact patient by phone.  Left voicemail with contact information and request for return call.

## 2023-12-03 NOTE — Progress Notes (Signed)
 Pulmonary Nodule Clinic Consult Note [facility]  [phone] [phone]  Patient Care Team: [facility] as PCP - General   Name of the patient: Vickie Hooper  [id number]  1961/09/09   Date of visit: 12/03/2023   Diagnosis- Lung Nodule  Chief complaint/ Reason for visit- Pulmonary Nodule Clinic Follow up Visit  Past Medical History:  Patient is managed/referred by PCP.  patient presented as a 63 year old female who is referred to pulmonary nodule clinic for incidental finding of lung nodule.  Patient was undergoing work-up in ER for COVID infection and was found to have elevated D-dimer.  This prompted CT angio which showed incidental pulmonary nodule. She overall feels well and denies worsening cough, chest pain, hemoptysis, unintentional weight loss.  02/20/21- CT Angio - 7 mm nodule in the RUL 11/14/21- CT Chest WO contrast - stable at 7 mm 05/16/23- CT Chest WO Contrast- felt to have enlarged, measuring 10 mm   Interval history- Patient is 64 year old female with above history of lung nodule who returns to clinic for discussion of ct resutls. She denies worsening cough, chest pain, unintentional weight loss, recurrent infections, or hemoptysis. She continues to smoke. Continues to drink alcohol daily and endorses 2 drinks this morning d/t anxiety regarding appointment. She is interested in getting help with her addictions and wants to quit smoking.   ECOG FS:0 - Asymptomatic  Review of systems- Review of Systems  Constitutional:  Negative for chills, fever, malaise/fatigue and weight loss.  HENT:  Negative for hearing loss, nosebleeds, sore throat and tinnitus.   Eyes:  Negative for blurred vision and double vision.  Respiratory:  Negative for cough, hemoptysis, shortness of breath and wheezing.   Cardiovascular:  Negative for chest pain, palpitations and leg swelling.  Gastrointestinal:  Negative for abdominal pain,  blood in stool, constipation, diarrhea, melena, nausea and vomiting.  Genitourinary:  Negative for dysuria and urgency.  Musculoskeletal:  Negative for back pain, falls, joint pain and myalgias.  Skin:  Negative for itching and rash.  Neurological:  Negative for dizziness, tingling, sensory change, loss of consciousness, weakness and headaches.  Endo/Heme/Allergies:  Negative for environmental allergies. Does not bruise/bleed easily.  Psychiatric/Behavioral:  Positive for substance abuse. Negative for depression. The patient is not nervous/anxious and does not have insomnia.      Allergies  Allergen Reactions   Penicillins    Past Medical History:  Diagnosis Date   Depression    Past Surgical History:  Procedure Laterality Date   CESAREAN SECTION  08/14/1989   Social History   Socioeconomic History   Marital status: Single    Spouse name: Not on file   Number of children: Not on file   Years of education: Not on file   Highest education level: Not on file  Occupational History   Not on file  Tobacco Use   Smoking status: Every Day    Current packs/day: 1.00    Average packs/day: 1 pack/day for 49.2 years (49.2 ttl pk-yrs)    Types: Cigarettes    Start date: 1976   Smokeless tobacco: Never  Vaping Use   Vaping status: Never Used  Substance and Sexual Activity   Alcohol use: Yes    Alcohol/week: 6.0 standard drinks of alcohol    Types: 6 Cans of beer per week    Comment: 6 beers a day   Drug use: Yes    Types: Marijuana   Sexual activity: Not on file  Other Topics Concern   Not on file  Social History Narrative   Not on file   Social Drivers of Health   Financial Resource Strain: Not on file  Food Insecurity: Not on file  Transportation Needs: Not on file  Physical Activity: Not on file  Stress: Not on file  Social Connections: Not on file  Intimate Partner Violence: Not on file   Family History  Problem Relation Age of Onset   Esophageal cancer Mother     COPD Mother    Cancer Father    Current Outpatient Medications:    ANORO ELLIPTA 62.5-25 MCG/ACT AEPB, Inhale 1 puff into the lungs daily., Disp: , Rfl:    cetirizine (ZYRTEC) 10 MG tablet, Take 10 mg by mouth daily as needed., Disp: , Rfl:    fluticasone (FLONASE) 50 MCG/ACT nasal spray, Place into both nostrils., Disp: , Rfl:    Multiple Vitamin (MULTIVITAMIN) capsule, MULTIVITAMINS ORAL CAPSULE, Disp: , Rfl:   Physical exam:  Vitals:   12/03/23 1305  BP: (!) 148/98  Pulse: 79  Resp: 16  Temp: (!) 97.1 F (36.2 C)  TempSrc: Tympanic  SpO2: 99%  Weight: 142 lb (64.4 kg)   Physical Exam Vitals reviewed.  Constitutional:      Appearance: She is not ill-appearing.  Pulmonary:     Effort: No respiratory distress.  Neurological:     Mental Status: She is alert and oriented to person, place, and time.  Psychiatric:        Mood and Affect: Mood normal.        Behavior: Behavior normal.       CT ABDOMEN PELVIS W CONTRAST Result Date: 11/16/2023 CLINICAL DATA:  Abdominal pain, acute, nonlocalized EXAM: CT ABDOMEN AND PELVIS WITH CONTRAST TECHNIQUE: Multidetector CT imaging of the abdomen and pelvis was performed using the standard protocol following bolus administration of intravenous contrast. RADIATION DOSE REDUCTION: This exam was performed according to the departmental dose-optimization program which includes automated exposure control, adjustment of the mA and/or kV according to patient size and/or use of iterative reconstruction technique. CONTRAST:  OMNIPAQUE IOHEXOL 350 MG/ML SOLN COMPARISON:  None Available. FINDINGS: Lower chest: No acute abnormality. Hepatobiliary: No focal liver abnormality. No gallstones, gallbladder wall thickening, or pericholecystic fluid. No biliary dilatation. Pancreas: No focal lesion. Normal pancreatic contour. No surrounding inflammatory changes. No main pancreatic ductal dilatation. Spleen: Normal in size without focal abnormality.  Adrenals/Urinary Tract: No adrenal nodule bilaterally. Bilateral kidneys enhance symmetrically. Punctate right nephrolithiasis. No left nephrolithiasis. No ureterolithiasis bilaterally. No hydronephrosis. No hydroureter. The urinary bladder is unremarkable. Stomach/Bowel: Stomach is within normal limits. No evidence of bowel wall thickening or dilatation. Colonic diverticulosis. The appendix is not definitely identified with no inflammatory changes in the right lower quadrant to suggest acute appendicitis. Vascular/Lymphatic: No abdominal aorta or iliac aneurysm. Severe atherosclerotic plaque of the aorta and its branches. No abdominal, pelvic, or inguinal lymphadenopathy. Reproductive: Uterus and bilateral adnexa are unremarkable. Other: No intraperitoneal free fluid. No intraperitoneal free gas. No organized fluid collection. Musculoskeletal: No abdominal wall hernia or abnormality. No suspicious lytic or blastic osseous lesions. No acute displaced fracture. IMPRESSION: 1. Nonobstructive punctate right nephrolithiasis. 2. Colonic diverticulosis with no acute diverticulitis. 3.  Aortic Atherosclerosis (ICD10-I70.0). Electronically Signed   By: Tish Frederickson M.D.   On: 11/16/2023 13:46    Assessment and plan- Patient is a 63 y.o. female   Lung nodule- incidental. First noted on 02/20/21 ct angio in RUL measuring 7 mm. She has  several risk factors that make her high risk for development of lung cancer. Stable on 11/16/21 imaging. 05/16/23- reveals some increase in nodule, now measuring 10 mm. No other new nodules and no lymphadenopathy. I independently reviewed CT Chest wo contrast from 11/20/23. Right upper lobe area that previously measured 10 mm appears stable. She has some new small nodules, 5 mm or less, and other previously noted nodules have resolved. She is now being followed by pulmonology and I reached out to Dr Aundria Rud who agrees to follow her for these as he manages her other pulmonary conditions.   Emphysema/COPD- on anoro per pcp. Encouraged use and tobacco cessation.  Nicotine dependence- interested in quitting. Will refer to virtual smoking cessation clinic. She is unable to use mychart and prefers telephone or in person visits. She has started nicotine replacement with Dr Aundria Rud.  Alcohol dependence- patient requests resources to assist with quitting. Referral sent. I reached out to [employer] with social work who agreed to see her. I provided her with contact information.   Disposition: Follow up with Dr Aundria Rud Ref to tobacco cessation Ref to [name] as needed -la  Visit Diagnosis 1. Incidental lung nodule, greater than or equal to 8mm   2. Substance abuse (HCC)    I spent 40 minutes face-to-face visit time dedicated to the care of this patient on the date of this encounter to include pre-visit review of ct chest, face-to-face time with the patient, and post visit ordering of testing/documentation.    Patient expressed understanding and was in agreement with this plan. She also understands that She can call clinic at any time with any questions, concerns, or complaints.   Thank you for allowing me to participate in the care of this patient.   Alinda Dooms, NP 12/03/2023

## 2023-12-04 ENCOUNTER — Ambulatory Visit (INDEPENDENT_AMBULATORY_CARE_PROVIDER_SITE_OTHER): Payer: Medicaid Other | Admitting: Urology

## 2023-12-04 ENCOUNTER — Encounter: Payer: Self-pay | Admitting: Urology

## 2023-12-04 VITALS — BP 160/91 | HR 78 | Ht 67.0 in | Wt 142.7 lb

## 2023-12-04 DIAGNOSIS — R3121 Asymptomatic microscopic hematuria: Secondary | ICD-10-CM

## 2023-12-04 DIAGNOSIS — N200 Calculus of kidney: Secondary | ICD-10-CM | POA: Diagnosis not present

## 2023-12-04 DIAGNOSIS — R102 Pelvic and perineal pain: Secondary | ICD-10-CM | POA: Diagnosis not present

## 2023-12-04 DIAGNOSIS — Z419 Encounter for procedure for purposes other than remedying health state, unspecified: Secondary | ICD-10-CM | POA: Diagnosis not present

## 2023-12-04 NOTE — Patient Instructions (Signed)

## 2023-12-04 NOTE — Progress Notes (Signed)
   12/04/23 1:02 PM   Vickie Hooper [birth year] [id number]  CC: Nephrolithiasis, pelvic pain, urinary symptoms  HPI: 63 year old female with long-term smoking history, 6-8 beer consumption per day referred for the above issues.  She has had a few episodes of lower pelvic pain and think she may have passed a kidney stone.  She was originally seen in the ER on 11/02/2023, urinalysis showed microscopic hematuria, she was treated for presumed kidney stone but no imaging was performed.  She had recurrent symptoms on 11/16/2023 with pelvic pain and urinalysis at that time was benign, culture no growth, and CT showed no hydronephrosis or other etiology of her symptoms, there is a punctate nonobstructing 1 mm right upper pole stone.  She also reports urinary urgency and frequency and nocturia 3-4 times per night.  She denies any gross hematuria.   PMH: Past Medical History:  Diagnosis Date   Depression    Hyperkalemia 03/21/2021   Joint swelling 06/25/2023   Kidney stone    Nipple discharge 06/25/2023   Pain in joint of right shoulder 08/07/2022   Routine history and physical examination of adult 07/13/2008   Weakness 06/25/2023    Surgical History: Past Surgical History:  Procedure Laterality Date   CESAREAN SECTION  08/14/1989     Family History: Family History  Problem Relation Age of Onset   Esophageal cancer Mother    COPD Mother    Cancer Father     Social History:  reports that she has been smoking cigarettes. She started smoking about 49 years ago. She has a 49.2 pack-year smoking history. She has never used smokeless tobacco. She reports current alcohol use of about 6.0 standard drinks of alcohol per week. She reports current drug use. Drug: Marijuana.  Physical Exam: BP (!) 160/91 (BP Location: Left Arm, Patient Position: Sitting, Cuff Size: Normal)   Pulse 78   Ht 5\' 7"  (1.702 m)   Wt 142 lb 11.2 oz (64.7 kg)   SpO2 96%   BMI 22.35 kg/m    Constitutional:  Alert and  oriented, No acute distress. Cardiovascular: No clubbing, cyanosis, or edema. Respiratory: Normal respiratory effort, no increased work of breathing. GI: Abdomen is soft, nontender, nondistended, no abdominal masses  Laboratory Data: Reviewed, see HPI  Pertinent Imaging: I have personally viewed and interpreted the CT scan dated 11/16/2023 showing no urologic abnormalities, punctate nonobstructing right upper pole renal stone.  Assessment & Plan:   63 year old female with pelvic pain of unclear etiology, no urologic issue on CT scan, punctate nonobstructing right upper pole stone.  She did have 1 episode of microscopic hematuria at the time of pain, unclear if this represented passage of any small stones no imaging was performed at that time.  She consumes a fair amount of alcohol during the day which likely contributes to her urgency, frequency, nocturia and behavioral strategies were discussed.  With her extensive smoking history, she was interested in further workup of her microscopic hematuria which I think is very reasonable, and we will coordinate follow-up for cystoscopy to rule out any other bladder cause of her symptoms.  Follow-up for cystoscopy Consider OAB medication in the future Behavioral strategies discussed  Legrand Rams, MD 12/04/2023  [facility] [address] [place], [place] [address] [phone]

## 2023-12-09 ENCOUNTER — Ambulatory Visit (INDEPENDENT_AMBULATORY_CARE_PROVIDER_SITE_OTHER): Payer: Medicaid Other | Admitting: Student in an Organized Health Care Education/Training Program

## 2023-12-09 ENCOUNTER — Encounter: Payer: Self-pay | Admitting: Student in an Organized Health Care Education/Training Program

## 2023-12-09 VITALS — BP 120/80 | HR 86 | Temp 97.6°F | Ht 67.0 in | Wt 143.6 lb

## 2023-12-09 DIAGNOSIS — F17210 Nicotine dependence, cigarettes, uncomplicated: Secondary | ICD-10-CM | POA: Diagnosis not present

## 2023-12-09 DIAGNOSIS — J432 Centrilobular emphysema: Secondary | ICD-10-CM | POA: Diagnosis not present

## 2023-12-09 DIAGNOSIS — R911 Solitary pulmonary nodule: Secondary | ICD-10-CM | POA: Diagnosis not present

## 2023-12-09 DIAGNOSIS — F17200 Nicotine dependence, unspecified, uncomplicated: Secondary | ICD-10-CM

## 2023-12-09 MED ORDER — ALBUTEROL SULFATE HFA 108 (90 BASE) MCG/ACT IN AERS
2.0000 | INHALATION_SPRAY | Freq: Four times a day (QID) | RESPIRATORY_TRACT | 6 refills | Status: AC | PRN
Start: 2023-12-09 — End: ?

## 2023-12-09 MED ORDER — AZITHROMYCIN 250 MG PO TABS
ORAL_TABLET | ORAL | 0 refills | Status: AC
Start: 2023-12-09 — End: 2023-12-14

## 2023-12-09 MED ORDER — NICOTINE 14 MG/24HR TD PT24: 14.0000 mg | MEDICATED_PATCH | TRANSDERMAL | 0 refills | Status: AC

## 2023-12-09 MED ORDER — NICOTINE POLACRILEX 2 MG MT LOZG
2.0000 mg | LOZENGE | OROMUCOSAL | 3 refills | Status: AC | PRN
Start: 2023-12-09 — End: 2024-03-08

## 2023-12-09 MED ORDER — NICOTINE 21 MG/24HR TD PT24
21.0000 mg | MEDICATED_PATCH | TRANSDERMAL | 0 refills | Status: AC
Start: 2023-12-09 — End: 2024-01-20

## 2023-12-09 MED ORDER — NICOTINE 7 MG/24HR TD PT24
7.0000 mg | MEDICATED_PATCH | TRANSDERMAL | 0 refills | Status: AC
Start: 2023-12-09 — End: 2023-12-23

## 2023-12-09 NOTE — Patient Instructions (Signed)
 The [facility] Quitline: Call 1-800-QUIT-NOW (864-806-5152). The [place] Quitline is a free service for [employer]. Trained counselors are available from 8 am until 3 am, 365 days per year. Services are available in both [place] and [place].   Web Resources Free online support programs can help you track your progress and share experiences with others who are quitting. These are examples: www.becomeanex.org www.trytostop.org  www.smokefree.gov  www.https://www.vargas.com/.aspx  [facility]: offers comprehensive in-person tobacco treatment counseling at [facility] ([address] [place] [address]).  Open to everyone. Virtual appointments available. Free parking. Call [phone] to schedule an appointment or [phone] for general information.    Tobacco Cessation Medications  Nicotine Replacement Therapy (NRT)  Nicotine is the addictive part of tobacco smoke, but not the most dangerous part. There are 7000 other toxins in cigarettes, including carbon monoxide, that cause disease. People do not generally become addicted to medication. Common problems: People don't use enough medication or stop too early. Medications are safe and effective. Overdose is very uncommon. Use medications as long as needed (3 months minimum). Some combinations work better than single medications. Long acting medications like the NRT patch and bupropion provide continuous treatment for withdrawal symptoms.  PLUS  Short acting medications like the NRT gum, lozenge, inhaler, and nasal spray help people to cope with breakthrough cravings.  ? Nicotine Patch  Place patch on hairless skin on upper body, including arms and back. Each day: discard old patch, shower, apply new patch to a different site. Apply hydrocortisone cream to mildly red/irritated areas. Call provider if rash develops. If patch causes sleep disturbance, remove patch  at bedtime and replace each morning after shower. Side effects may include: skin irritation, headache, insomnia, abnormal/vivid dreams.  ? Nicotine Gum  Chew gum slowly, park in cheek when peppery taste or tingling sensation begins (about 15-30 chews). When taste or tingling goes away, begin chewing again. Use until nicotine is gone (taste or tingle does not return, usually 30 minutes). Park in different areas of mouth. Nicotine is absorbed through the lining of the mouth. Use enough to control cravings, up to 24 pieces per day (if used alone). Avoid eating or drinking for 15 minutes before using and during use. Side effects may include: mouth/jaw soreness, hiccups, indigestion, hypersalivation.  If gum is not chewed correctly, additional side effects may include lightheadedness, nausea/vomiting, throat and mouth irritation.  ? Nicotine Lozenge  Allow to dissolve slowly in mouth (20-30 minutes). Do not chew or swallow. Nicotine release may cause a warm tingling sensation. Occasionally rotate to different areas of the mouth. Use enough to control cravings, up to 20 lozenges per day (if used alone). Avoid eating or drinking for 15 minutes before using and during use. Side effects may include: nausea, hiccups, cough, heartburn, headache, gas, insomnia.  ? Nicotine Nasal Spray Use 1 spray in each nostril (1 dose) and tilt head back for 1 minute. Do not sniff, swallow, or inhale through nose.  Use at least 8 doses (1 spray in each nostril) , up to 40 doses per day (if used alone). To reduce nasal irritation, spray on cotton swab and insert into nose. Side effects may include: nasal and/or throat irritation (hot, peppery, or burning sensation), nasal irritation, tearing, sneezing, cough, headache.  ? Nicotine Oral Inhaler (puffer) Inhale into the back of the throat or puff in short breaths. Do not inhale into the lungs.  Puff continuously for 20 minutes (about 80 puffs) until cartridge  is  empty. Change cartridge when it loses the "burning in throat" sensation (feels like air only). Open cartridges can be saved and used again within 24 hours. Use at least 6 and up to 16 cartridges per day (if used alone).  Avoid eating or drinking for 15 minutes before using and during use. Side effects may include: mouth and/or throat irritation, unpleasant taste, cough, nasal irritation, indigestion, hiccups, headache.  ? Chantix (varenicline) Days 1-3: Take one 0.5 mg white pill each morning for 3 days, one week before quit date. Days 4-7: Increase to one 0.5 mg white pill twice a day in morning and evening for 4 days.  On Day 8 (target quit date), increase to one 1 mg blue pill twice a day. Maintain this dose for a minimum of 3 months. Take with food and a full glass of water to reduce nausea. Be sure that the two doses are at least 8 hours apart, but try to take second dose early in the evening (i.e. 6 pm) to avoid sleep problems. Common side effects include: nausea, insomnia, headache, abnormal/vivid dreams. Tell your doctor if you have any history of psychiatric illness prior to starting Chantix.  STOP taking CHANTIX and contact a healthcare provider immediately if you experience agitation, hostility, depressed mood, changes in thoughts or behavior that are not typical for you, thinking about or attempting suicide, allergic or skin reactions including swelling, rash, redness, or peeling of the skin.  For patients who have heart disease: Smoking is a major risk factor for cardiovascular disease, and Chantix can help you quit smoking. Chantix may be associated with a small, increased risk of certain heart events in patients who have heart disease. If you have any new or worsening symptoms of heart disease while taking Chantix, such as shortness of breath or trouble breathing, new or worsening chest pain, or new or worsening pain in your legs when walking, call your doctor or get emergency medical  help immediately.  ? Wellbutrin / Zyban (bupropion) Take one 150 mg pill each morning for 3 days, one week before target quit date. On Day 4, increase to one 150 mg pill twice a day, morning and evening.  Maintain this dose for a minimum of 3 months. Be sure that the two doses are at least 8 hours apart, but try to take second dose early in the evening (i.e. 6 pm) to avoid sleep problems. Avoid or minimize use of alcohol when taking this medication. Common side effects include: dry mouth, headache, insomnia, nausea, weight loss.  Risk of seizure is 09/998. STOP taking BUPROPION and contact a healthcare provider immediately if you experience agitation, hostility, depressed mood, changes in thoughts or behavior that are not typical for you, thinking about or attempting suicide, allergic or skin reactions including swelling, rash, redness, or peeling of the skin.

## 2023-12-09 NOTE — Progress Notes (Unsigned)
 Synopsis: Referred in *** by [facility] Service*  Assessment & Plan:   1. Centrilobular emphysema (HCC) (Primary) *** - Ambulatory Referral for Lung Cancer Scre - albuterol (VENTOLIN HFA) 108 (90 Base) MCG/ACT inhaler; Inhale 2 puffs into the lungs every 6 (six) hours as needed for wheezing or shortness of breath.  Dispense: 8 g; Refill: 6 - azithromycin (ZITHROMAX) 250 MG tablet; Take 2 tablets (500 mg) on  Day 1,  followed by 1 tablet (250 mg) once daily on Days 2 through 5.  Dispense: 6 each; Refill: 0  2. Tobacco use disorder *** - Ambulatory Referral for Lung Cancer Scre - nicotine (NICODERM CQ - DOSED IN MG/24 HOURS) 21 mg/24hr patch; Place 1 patch (21 mg total) onto the skin daily.  Dispense: 42 patch; Refill: 0 - nicotine (NICODERM CQ - DOSED IN MG/24 HOURS) 14 mg/24hr patch; Place 1 patch (14 mg total) onto the skin daily for 14 days.  Dispense: 14 patch; Refill: 0 - nicotine (NICODERM CQ - DOSED IN MG/24 HR) 7 mg/24hr patch; Place 1 patch (7 mg total) onto the skin daily for 14 days.  Dispense: 14 patch; Refill: 0 - nicotine polacrilex (NICOTINE MINI) 2 MG lozenge; Take 1 lozenge (2 mg total) by mouth every 2 (two) hours as needed for smoking cessation.  Dispense: 72 lozenge; Refill: 3  Increased cough and sputum over the last few days, no wheeze on exam. No PFT's. Compliant with [employer]. Continues to smoke, a pack a day. Will prescribe nicotine patches and lozenges (interested in quitting). Will also send a course of Zpak. Refer to lung cancer screen.  Return in about 6 months (around 06/10/2024).  I spent *** minutes caring for this patient today, including {EM billing:[id number]}  Raechel Chute, MD [facility] 12/09/2023 11:54 AM    End of visit medications:  Meds ordered this encounter  Medications   nicotine (NICODERM CQ - DOSED IN MG/24 HOURS) 21 mg/24hr patch    Sig: Place 1 patch (21 mg total) onto the skin daily.    Dispense:  42 patch     Refill:  0   nicotine (NICODERM CQ - DOSED IN MG/24 HOURS) 14 mg/24hr patch    Sig: Place 1 patch (14 mg total) onto the skin daily for 14 days.    Dispense:  14 patch    Refill:  0   nicotine (NICODERM CQ - DOSED IN MG/24 HR) 7 mg/24hr patch    Sig: Place 1 patch (7 mg total) onto the skin daily for 14 days.    Dispense:  14 patch    Refill:  0   nicotine polacrilex (NICOTINE MINI) 2 MG lozenge    Sig: Take 1 lozenge (2 mg total) by mouth every 2 (two) hours as needed for smoking cessation.    Dispense:  72 lozenge    Refill:  3   albuterol (VENTOLIN HFA) 108 (90 Base) MCG/ACT inhaler    Sig: Inhale 2 puffs into the lungs every 6 (six) hours as needed for wheezing or shortness of breath.    Dispense:  8 g    Refill:  6   azithromycin (ZITHROMAX) 250 MG tablet    Sig: Take 2 tablets (500 mg) on  Day 1,  followed by 1 tablet (250 mg) once daily on Days 2 through 5.    Dispense:  6 each    Refill:  0     Current Outpatient Medications:    albuterol (VENTOLIN HFA) 108 (90  Base) MCG/ACT inhaler, Inhale 2 puffs into the lungs every 6 (six) hours as needed for wheezing or shortness of breath., Disp: 8 g, Rfl: 6   ANORO ELLIPTA 62.5-25 MCG/ACT AEPB, Inhale 1 puff into the lungs daily., Disp: , Rfl:    aspirin EC 81 MG tablet, Take 81 mg by mouth daily. Swallow whole., Disp: , Rfl:    azithromycin (ZITHROMAX) 250 MG tablet, Take 2 tablets (500 mg) on  Day 1,  followed by 1 tablet (250 mg) once daily on Days 2 through 5., Disp: 6 each, Rfl: 0   cetirizine (ZYRTEC) 10 MG tablet, Take 10 mg by mouth daily as needed., Disp: , Rfl:    fluticasone (FLONASE) 50 MCG/ACT nasal spray, Place into both nostrils., Disp: , Rfl:    Multiple Vitamin (MULTIVITAMIN) capsule, MULTIVITAMINS ORAL CAPSULE, Disp: , Rfl:    nicotine (NICODERM CQ - DOSED IN MG/24 HOURS) 14 mg/24hr patch, Place 1 patch (14 mg total) onto the skin daily for 14 days., Disp: 14 patch, Rfl: 0   nicotine (NICODERM CQ - DOSED IN MG/24  HOURS) 21 mg/24hr patch, Place 1 patch (21 mg total) onto the skin daily., Disp: 42 patch, Rfl: 0   nicotine (NICODERM CQ - DOSED IN MG/24 HR) 7 mg/24hr patch, Place 1 patch (7 mg total) onto the skin daily for 14 days., Disp: 14 patch, Rfl: 0   nicotine polacrilex (NICOTINE MINI) 2 MG lozenge, Take 1 lozenge (2 mg total) by mouth every 2 (two) hours as needed for smoking cessation., Disp: 72 lozenge, Rfl: 3   Subjective:   PATIENT ID: Vickie Hooper GENDER: female DOB: Jul 12, 1961, MRN: [id number]  Chief Complaint  Patient presents with   Follow-up    Cough worse at night. Shortness of breath on exertion. Occasional wheezing.     HPI ***  Ancillary information including prior medications, full medical/surgical/family/social histories, and PFTs (when available) are listed below and have been reviewed.   ROS   Objective:   Vitals:   12/09/23 1118  BP: 120/80  Pulse: 86  Temp: 97.6 F (36.4 C)  TempSrc: Temporal  SpO2: 97%  Weight: 143 lb 9.6 oz (65.1 kg)  Height: 5\' 7"  (1.702 m)   97% on *** LPM *** RA BMI Readings from Last 3 Encounters:  12/09/23 22.49 kg/m  12/04/23 22.35 kg/m  12/03/23 22.24 kg/m   Wt Readings from Last 3 Encounters:  12/09/23 143 lb 9.6 oz (65.1 kg)  12/04/23 142 lb 11.2 oz (64.7 kg)  12/03/23 142 lb (64.4 kg)    Physical Exam    Ancillary Information    Past Medical History:  Diagnosis Date   Depression    Hyperkalemia 03/21/2021   Joint swelling 06/25/2023   Kidney stone    Nipple discharge 06/25/2023   Pain in joint of right shoulder 08/07/2022   Routine history and physical examination of adult 07/13/2008   Weakness 06/25/2023     Family History  Problem Relation Age of Onset   Esophageal cancer Mother    COPD Mother    Cancer Father      Past Surgical History:  Procedure Laterality Date   CESAREAN SECTION  08/14/1989    Social History   Socioeconomic History   Marital status: Single    Spouse name: Not on  file   Number of children: Not on file   Years of education: Not on file   Highest education level: Not on file  Occupational History   Not on file  Tobacco Use   Smoking status: Every Day    Current packs/day: 1.00    Average packs/day: 1 pack/day for 49.2 years (49.2 ttl pk-yrs)    Types: Cigarettes    Start date: 1976   Smokeless tobacco: Never  Vaping Use   Vaping status: Never Used  Substance and Sexual Activity   Alcohol use: Yes    Alcohol/week: 6.0 standard drinks of alcohol    Types: 6 Cans of beer per week    Comment: 6 beers a day   Drug use: Yes    Types: Marijuana   Sexual activity: Not on file  Other Topics Concern   Not on file  Social History Narrative   Not on file   Social Drivers of Health   Financial Resource Strain: Not on file  Food Insecurity: Not on file  Transportation Needs: Not on file  Physical Activity: Not on file  Stress: Not on file  Social Connections: Not on file  Intimate Partner Violence: Not on file     Allergies  Allergen Reactions   Penicillins    Statins     Other Reaction(s): muscle aches     CBC    Component Value Date/Time   WBC 10.2 11/16/2023 1215   RBC 4.29 11/16/2023 1215   HGB 13.8 11/16/2023 1215   HCT 40.1 11/16/2023 1215   PLT 344 11/16/2023 1215   MCV 93.5 11/16/2023 1215   MCH 32.2 11/16/2023 1215   MCHC 34.4 11/16/2023 1215   RDW 13.5 11/16/2023 1215    Pulmonary Functions Testing Results:     No data to display          Outpatient Medications Prior to Visit  Medication Sig Dispense Refill   ANORO ELLIPTA 62.5-25 MCG/ACT AEPB Inhale 1 puff into the lungs daily.     aspirin EC 81 MG tablet Take 81 mg by mouth daily. Swallow whole.     cetirizine (ZYRTEC) 10 MG tablet Take 10 mg by mouth daily as needed.     fluticasone (FLONASE) 50 MCG/ACT nasal spray Place into both nostrils.     Multiple Vitamin (MULTIVITAMIN) capsule MULTIVITAMINS ORAL CAPSULE     No facility-administered medications  prior to visit.

## 2023-12-10 ENCOUNTER — Telehealth (HOSPITAL_COMMUNITY): Payer: Self-pay

## 2023-12-10 NOTE — Telephone Encounter (Signed)
 This therapist receives a referral from [place], LCSW, [facility], LCAS for possible SAIOP.  This therapist calls Vickie Hooper and confirms she is speaking with the correct person by obtaining two identifiers.  Therapist explains what Vickie Hooper is and asks if she is interested in services. Vickie Hooper says her medicaid will only provide transporation within [place] where she lives and will not cross [employer] and she has no other transportation.  Because of pt being located in [place] and is [employer] funded, she is not eligible for the program. She says she will check with the [place] office and see if they offer therapy services.  Remigio Eisenmenger, MS, LMFT, LCAS

## 2023-12-12 NOTE — Addendum Note (Signed)
 Addended byRaechel Chute on: 12/12/2023 05:00 PM   Modules accepted: Orders

## 2023-12-24 ENCOUNTER — Ambulatory Visit (INDEPENDENT_AMBULATORY_CARE_PROVIDER_SITE_OTHER): Admitting: Urology

## 2023-12-24 VITALS — BP 145/100 | HR 130 | Ht 67.0 in | Wt 138.2 lb

## 2023-12-24 DIAGNOSIS — R3121 Asymptomatic microscopic hematuria: Secondary | ICD-10-CM

## 2023-12-24 DIAGNOSIS — R102 Pelvic and perineal pain: Secondary | ICD-10-CM | POA: Diagnosis not present

## 2023-12-24 DIAGNOSIS — Z792 Long term (current) use of antibiotics: Secondary | ICD-10-CM

## 2023-12-24 DIAGNOSIS — N3281 Overactive bladder: Secondary | ICD-10-CM

## 2023-12-24 MED ORDER — OXYBUTYNIN CHLORIDE ER 10 MG PO TB24: 10.0000 mg | ORAL_TABLET | Freq: Every day | ORAL | 11 refills | Status: AC

## 2023-12-24 MED ORDER — NITROFURANTOIN MONOHYD MACRO 100 MG PO CAPS
100.0000 mg | ORAL_CAPSULE | Freq: Once | ORAL | Status: AC
Start: 2023-12-24 — End: 2023-12-24
  Administered 2023-12-24: 100 mg via ORAL

## 2023-12-24 NOTE — Progress Notes (Signed)
 Cystoscopy Procedure Note:  Indication: Microscopic hematuria, pelvic pain  Nitrofurantoin given for prophylaxis  After informed consent and discussion of the procedure and its risks, Vickie Hooper was positioned and prepped in the standard fashion. Cystoscopy was performed with a flexible cystoscope. The urethra, bladder neck and entire bladder was visualized in a standard fashion. The ureteral orifices were visualized in their normal location and orientation.  Bladder mucosa was grossly normal throughout, no abnormalities on retroflexion.  Imaging: CT abdomen and pelvis with contrast 11/16/2023 no abnormal findings  Findings: Normal cystoscopy  -----------------------------------------------------------------------  Assessment and Plan: 63 year old female with 1 episode of microscopic hematuria, negative CT abdomen and pelvis with contrast, normal cystoscopy.  She does have some persistent overactive bladder symptoms, consumes high volume of alcohol during the day and we reviewed behavioral strategies at length.  She is bothered enough she would like to try medicine.  We discussed that overactive bladder (OAB) is not a disease, but is a symptom complex that is generally not life-threatening.  Symptoms typically include urinary urgency, frequency, and urge incontinence.  There are numerous treatment options, however there are risks and benefits with both medical and surgical management.  First-line treatment is behavioral therapies including bladder training, pelvic floor muscle training, and fluid management.  Second line treatments include oral antimuscarinics(Ditropan er, Trospium) and beta-3 agonist (Mybetriq). There is typically a period of medication trial (4-8 weeks) to find the optimal therapy and dosing. If symptoms are bothersome despite the above management, third line options include intra-detrusor botox, peripheral tibial nerve stimulation (PTNS), and interstim (SNS). These are more  invasive treatments with higher side effect profile, but may improve quality of life for patients with severe OAB symptoms.   Trial of oxybutynin 10 mg XL daily OAB behavioral strategies and avoiding bladder irritants discussed at length RTC 6 weeks PA symptom check  Legrand Rams, MD 12/24/2023

## 2023-12-24 NOTE — Patient Instructions (Signed)

## 2024-01-04 DIAGNOSIS — Z419 Encounter for procedure for purposes other than remedying health state, unspecified: Secondary | ICD-10-CM | POA: Diagnosis not present

## 2024-02-03 ENCOUNTER — Ambulatory Visit: Admitting: Urology

## 2024-02-03 ENCOUNTER — Encounter: Payer: Self-pay | Admitting: Urology

## 2024-02-03 DIAGNOSIS — Z419 Encounter for procedure for purposes other than remedying health state, unspecified: Secondary | ICD-10-CM | POA: Diagnosis not present

## 2024-02-06 ENCOUNTER — Telehealth: Admitting: Physician Assistant

## 2024-02-06 NOTE — Progress Notes (Signed)
 The patient no-showed for appointment despite this provider sending direct link with no response and waiting for at least 10 minutes from appointment time for patient to join. They will be marked as a NS for this appointment/time.   Piedad Climes, PA-C

## 2024-02-10 DIAGNOSIS — Z0000 Encounter for general adult medical examination without abnormal findings: Secondary | ICD-10-CM | POA: Diagnosis not present

## 2024-02-10 DIAGNOSIS — F17210 Nicotine dependence, cigarettes, uncomplicated: Secondary | ICD-10-CM | POA: Diagnosis not present

## 2024-02-10 DIAGNOSIS — E785 Hyperlipidemia, unspecified: Secondary | ICD-10-CM | POA: Diagnosis not present

## 2024-02-10 DIAGNOSIS — Z1389 Encounter for screening for other disorder: Secondary | ICD-10-CM | POA: Diagnosis not present

## 2024-02-10 DIAGNOSIS — J449 Chronic obstructive pulmonary disease, unspecified: Secondary | ICD-10-CM | POA: Diagnosis not present

## 2024-02-10 DIAGNOSIS — R030 Elevated blood-pressure reading, without diagnosis of hypertension: Secondary | ICD-10-CM | POA: Diagnosis not present

## 2024-03-05 ENCOUNTER — Ambulatory Visit: Admitting: Student in an Organized Health Care Education/Training Program

## 2024-03-05 DIAGNOSIS — Z419 Encounter for procedure for purposes other than remedying health state, unspecified: Secondary | ICD-10-CM | POA: Diagnosis not present

## 2024-03-11 DIAGNOSIS — Z124 Encounter for screening for malignant neoplasm of cervix: Secondary | ICD-10-CM | POA: Diagnosis not present

## 2024-03-11 DIAGNOSIS — R030 Elevated blood-pressure reading, without diagnosis of hypertension: Secondary | ICD-10-CM | POA: Diagnosis not present

## 2024-03-11 DIAGNOSIS — Z0000 Encounter for general adult medical examination without abnormal findings: Secondary | ICD-10-CM | POA: Diagnosis not present

## 2024-03-11 DIAGNOSIS — Z1389 Encounter for screening for other disorder: Secondary | ICD-10-CM | POA: Diagnosis not present

## 2024-03-18 ENCOUNTER — Ambulatory Visit
Admission: RE | Admit: 2024-03-18 | Discharge: 2024-03-18 | Disposition: A | Discharge status: Home / Self Care | Source: Ambulatory Visit | Attending: Student in an Organized Health Care Education/Training Program | Admitting: Student in an Organized Health Care Education/Training Program

## 2024-03-18 DIAGNOSIS — R911 Solitary pulmonary nodule: Secondary | ICD-10-CM | POA: Insufficient documentation

## 2024-03-18 DIAGNOSIS — F17200 Nicotine dependence, unspecified, uncomplicated: Secondary | ICD-10-CM | POA: Diagnosis not present

## 2024-03-18 DIAGNOSIS — J432 Centrilobular emphysema: Secondary | ICD-10-CM | POA: Diagnosis not present

## 2024-03-18 DIAGNOSIS — I700 Atherosclerosis of aorta: Secondary | ICD-10-CM | POA: Diagnosis not present

## 2024-04-01 ENCOUNTER — Inpatient Hospital Stay: Attending: Nurse Practitioner | Admitting: Licensed Clinical Social Worker

## 2024-04-01 NOTE — Progress Notes (Signed)
 [facility] Clinical Social Work  Clinical Social Work was referred by medical provider for need for community resources.  Clinical Social Worker contacted patient by phone to offer support and assess for needs.    Patient stated she wanted smoking cessation in [facility] since [place] will not transport outside of county.  Interventions: Referred patient to community resources: Smoking cessation group.       Follow Up Plan:  CSW will follow-up with patient by phone     Macario CHRISTELLA Au, LCSW  Clinical Social Worker [facility]        Patient is participating in a Managed Medicaid Plan:  Yes

## 2024-04-03 ENCOUNTER — Ambulatory Visit: Admitting: Student in an Organized Health Care Education/Training Program

## 2024-04-04 DIAGNOSIS — Z419 Encounter for procedure for purposes other than remedying health state, unspecified: Secondary | ICD-10-CM | POA: Diagnosis not present

## 2024-04-06 DIAGNOSIS — M180 Bilateral primary osteoarthritis of first carpometacarpal joints: Secondary | ICD-10-CM | POA: Diagnosis not present

## 2024-04-06 DIAGNOSIS — M2550 Pain in unspecified joint: Secondary | ICD-10-CM | POA: Diagnosis not present

## 2024-04-06 DIAGNOSIS — M25552 Pain in left hip: Secondary | ICD-10-CM | POA: Diagnosis not present

## 2024-04-06 DIAGNOSIS — M47812 Spondylosis without myelopathy or radiculopathy, cervical region: Secondary | ICD-10-CM | POA: Diagnosis not present

## 2024-04-06 DIAGNOSIS — E559 Vitamin D deficiency, unspecified: Secondary | ICD-10-CM | POA: Diagnosis not present

## 2024-04-07 ENCOUNTER — Ambulatory Visit: Payer: Self-pay | Admitting: Student in an Organized Health Care Education/Training Program

## 2024-04-14 DIAGNOSIS — Z0131 Encounter for examination of blood pressure with abnormal findings: Secondary | ICD-10-CM | POA: Diagnosis not present

## 2024-04-14 DIAGNOSIS — Z1389 Encounter for screening for other disorder: Secondary | ICD-10-CM | POA: Diagnosis not present

## 2024-04-14 DIAGNOSIS — Z1331 Encounter for screening for depression: Secondary | ICD-10-CM | POA: Diagnosis not present

## 2024-04-14 DIAGNOSIS — Z0000 Encounter for general adult medical examination without abnormal findings: Secondary | ICD-10-CM | POA: Diagnosis not present

## 2024-04-14 DIAGNOSIS — I10 Essential (primary) hypertension: Secondary | ICD-10-CM | POA: Diagnosis not present

## 2024-04-22 ENCOUNTER — Encounter: Payer: Self-pay | Admitting: Student in an Organized Health Care Education/Training Program

## 2024-04-22 ENCOUNTER — Ambulatory Visit: Admitting: Student in an Organized Health Care Education/Training Program

## 2024-04-22 VITALS — BP 108/60 | HR 87 | Temp 98.4°F | Ht 66.0 in | Wt 143.0 lb

## 2024-04-22 DIAGNOSIS — F17200 Nicotine dependence, unspecified, uncomplicated: Secondary | ICD-10-CM

## 2024-04-22 DIAGNOSIS — J432 Centrilobular emphysema: Secondary | ICD-10-CM

## 2024-04-22 DIAGNOSIS — R911 Solitary pulmonary nodule: Secondary | ICD-10-CM | POA: Diagnosis not present

## 2024-04-22 DIAGNOSIS — F17210 Nicotine dependence, cigarettes, uncomplicated: Secondary | ICD-10-CM | POA: Diagnosis not present

## 2024-04-22 NOTE — Progress Notes (Unsigned)
 Synopsis: Referred in *** by [facility]*  Assessment & Plan:   1. Tobacco use disorder  Continues to smoke, counseled against  - Ambulatory Referral for Lung Cancer Scre  2. Centrilobular emphysema (HCC) (Primary)  Continues with anoro and PRN ventolin . No exacerbation since last visit. Still short of breath. Still no PFT's. Will re-order. Consider ensifentrine  pending symptomatic response  - Pulmonary Function Test; Future  3. Lung nodule  CT reassuring. Will refer to LDCT for screening   Return in about 6 months (around 10/23/2024).  I spent *** minutes caring for this patient today, including {EM billing:28027}  Belva November, MD [facility] 04/22/2024 2:31 PM    End of visit medications:  No orders of the defined types were placed in this encounter.    Current Outpatient Medications:    albuterol  (VENTOLIN  HFA) 108 (90 Base) MCG/ACT inhaler, Inhale 2 puffs into the lungs every 6 (six) hours as needed for wheezing or shortness of breath., Disp: 8 g, Rfl: 6   amLODipine (NORVASC) 5 MG tablet, Take 5 mg by mouth daily., Disp: , Rfl:    ANORO ELLIPTA 62.5-25 MCG/ACT AEPB, Inhale 1 puff into the lungs daily., Disp: , Rfl:    aspirin EC 81 MG tablet, Take 81 mg by mouth daily. Swallow whole., Disp: , Rfl:    celecoxib (CELEBREX) 100 MG capsule, Take 100 mg by mouth 2 (two) times daily., Disp: , Rfl:    cetirizine (ZYRTEC) 10 MG tablet, Take 10 mg by mouth daily as needed., Disp: , Rfl:    fluticasone (FLONASE) 50 MCG/ACT nasal spray, Place into both nostrils., Disp: , Rfl:    guaiFENesin 200 MG tablet, Take 200 mg by mouth every 4 (four) hours as needed for cough or to loosen phlegm., Disp: , Rfl:    Multiple Vitamin (MULTIVITAMIN) capsule, MULTIVITAMINS ORAL CAPSULE, Disp: , Rfl:    nicotine  (NICODERM CQ  - DOSED IN MG/24 HOURS) 21 mg/24hr patch, Place 21 mg onto the skin daily., Disp: , Rfl:    nicotine  polacrilex (COMMIT) 2 MG lozenge,  Take 2 mg by mouth as needed for smoking cessation., Disp: , Rfl:    oxybutynin  (DITROPAN -XL) 10 MG 24 hr tablet, Take 1 tablet (10 mg total) by mouth daily., Disp: 30 tablet, Rfl: 11   rosuvastatin (CRESTOR) 10 MG tablet, Take 10 mg by mouth daily., Disp: , Rfl:    VASCEPA 1 g capsule, Take 2 g by mouth 2 (two) times daily., Disp: , Rfl:    Subjective:   PATIENT ID: Vickie Hooper GENDER: female DOB: 12-16-1960, MRN: [id number]  Chief Complaint  Patient presents with   Follow-up    Centrilobular emphysema, lung nodule, CT: 03/27/24  Breathing has been ok -- no new changes per se -- the humidity and heat has been aggravating, but she stays inside more now and has had to use the albuterol  inhaler which was helpful. First morning coughs are productive but otherwise is dry and can feel like it's stuck in her chest. And can have coughing fits when she first goes to bed as well.    HPI ***  Ancillary information including prior medications, full medical/surgical/family/social histories, and PFTs (when available) are listed below and have been reviewed.   ROS   Objective:   Vitals:   04/22/24 1417  BP: 108/60  Pulse: 87  Temp: 98.4 F (36.9 C)  TempSrc: Oral  SpO2: 94%  Weight: 143 lb (64.9 kg)  Height: 5' 6 (1.676 m)  94% on *** LPM *** RA BMI Readings from Last 3 Encounters:  04/22/24 23.08 kg/m  12/24/23 21.65 kg/m  12/09/23 22.49 kg/m   Wt Readings from Last 3 Encounters:  04/22/24 143 lb (64.9 kg)  12/24/23 138 lb 4 oz (62.7 kg)  12/09/23 143 lb 9.6 oz (65.1 kg)    Physical Exam    Ancillary Information    Past Medical History:  Diagnosis Date   Depression    Hyperkalemia 03/21/2021   Joint swelling 06/25/2023   Kidney stone    Nipple discharge 06/25/2023   Pain in joint of right shoulder 08/07/2022   Routine history and physical examination of adult 07/13/2008   Weakness 06/25/2023     Family History  Problem Relation Age of Onset   Esophageal  cancer Mother    COPD Mother    Cancer Father      Past Surgical History:  Procedure Laterality Date   CESAREAN SECTION  08/14/1989    Social History   Socioeconomic History   Marital status: Single    Spouse name: Not on file   Number of children: Not on file   Years of education: Not on file   Highest education level: Not on file  Occupational History   Not on file  Tobacco Use   Smoking status: Every Day    Current packs/day: 1.00    Average packs/day: 1 pack/day for 49.6 years (49.6 ttl pk-yrs)    Types: Cigarettes    Start date: 1976   Smokeless tobacco: Never  Vaping Use   Vaping status: Never Used  Substance and Sexual Activity   Alcohol use: Yes    Alcohol/week: 6.0 standard drinks of alcohol    Types: 6 Cans of beer per week    Comment: 6 beers a day   Drug use: Yes    Types: Marijuana   Sexual activity: Not on file  Other Topics Concern   Not on file  Social History Narrative   Not on file   Social Drivers of Health   Financial Resource Strain: Not on file  Food Insecurity: Not on file  Transportation Needs: Not on file  Physical Activity: Not on file  Stress: Not on file  Social Connections: Not on file  Intimate Partner Violence: Not on file     Allergies  Allergen Reactions   Penicillins Hives   Statins     Other Reaction(s): muscle aches     CBC    Component Value Date/Time   WBC 10.2 11/16/2023 1215   RBC 4.29 11/16/2023 1215   HGB 13.8 11/16/2023 1215   HCT 40.1 11/16/2023 1215   PLT 344 11/16/2023 1215   MCV 93.5 11/16/2023 1215   MCH 32.2 11/16/2023 1215   MCHC 34.4 11/16/2023 1215   RDW 13.5 11/16/2023 1215    Pulmonary Functions Testing Results:     No data to display          Outpatient Medications Prior to Visit  Medication Sig Dispense Refill   albuterol  (VENTOLIN  HFA) 108 (90 Base) MCG/ACT inhaler Inhale 2 puffs into the lungs every 6 (six) hours as needed for wheezing or shortness of breath. 8 g 6    amLODipine (NORVASC) 5 MG tablet Take 5 mg by mouth daily.     ANORO ELLIPTA 62.5-25 MCG/ACT AEPB Inhale 1 puff into the lungs daily.     aspirin EC 81 MG tablet Take 81 mg by mouth daily. Swallow whole.     celecoxib (CELEBREX)  100 MG capsule Take 100 mg by mouth 2 (two) times daily.     cetirizine (ZYRTEC) 10 MG tablet Take 10 mg by mouth daily as needed.     fluticasone (FLONASE) 50 MCG/ACT nasal spray Place into both nostrils.     guaiFENesin 200 MG tablet Take 200 mg by mouth every 4 (four) hours as needed for cough or to loosen phlegm.     Multiple Vitamin (MULTIVITAMIN) capsule MULTIVITAMINS ORAL CAPSULE     nicotine  (NICODERM CQ  - DOSED IN MG/24 HOURS) 21 mg/24hr patch Place 21 mg onto the skin daily.     nicotine  polacrilex (COMMIT) 2 MG lozenge Take 2 mg by mouth as needed for smoking cessation.     oxybutynin  (DITROPAN -XL) 10 MG 24 hr tablet Take 1 tablet (10 mg total) by mouth daily. 30 tablet 11   rosuvastatin (CRESTOR) 10 MG tablet Take 10 mg by mouth daily.     VASCEPA 1 g capsule Take 2 g by mouth 2 (two) times daily.     No facility-administered medications prior to visit.

## 2024-05-06 ENCOUNTER — Ambulatory Visit: Admitting: Student in an Organized Health Care Education/Training Program

## 2024-05-06 DIAGNOSIS — J432 Centrilobular emphysema: Secondary | ICD-10-CM | POA: Diagnosis not present

## 2024-05-06 LAB — PULMONARY FUNCTION TEST
DL/VA % pred: 48 %
DL/VA: 2.02 ml/min/mmHg/L
DLCO unc % pred: 56 %
DLCO unc: 11.87 ml/min/mmHg
FEF 25-75 Post: 0.89 L/s
FEF 25-75 Pre: 0.92 L/s
FEF2575-%Change-Post: -2 %
FEF2575-%Pred-Post: 38 %
FEF2575-%Pred-Pre: 39 %
FEV1-%Change-Post: -1 %
FEV1-%Pred-Post: 80 %
FEV1-%Pred-Pre: 81 %
FEV1-Post: 2.14 L
FEV1-Pre: 2.17 L
FEV1FVC-%Change-Post: 0 %
FEV1FVC-%Pred-Pre: 70 %
FEV6-%Change-Post: 0 %
FEV6-%Pred-Post: 113 %
FEV6-%Pred-Pre: 114 %
FEV6-Post: 3.78 L
FEV6-Pre: 3.81 L
FEV6FVC-%Change-Post: 1 %
FEV6FVC-%Pred-Post: 101 %
FEV6FVC-%Pred-Pre: 100 %
FVC-%Change-Post: -1 %
FVC-%Pred-Post: 111 %
FVC-%Pred-Pre: 114 %
FVC-Post: 3.88 L
FVC-Pre: 3.96 L
Post FEV1/FVC ratio: 55 %
Post FEV6/FVC ratio: 97 %
Pre FEV1/FVC ratio: 55 %
Pre FEV6/FVC Ratio: 96 %
RV % pred: 117 %
RV: 2.52 L
TLC % pred: 124 %
TLC: 6.68 L

## 2024-05-06 NOTE — Progress Notes (Signed)
 Full PFT completed today ? ?

## 2024-05-06 NOTE — Patient Instructions (Signed)
 Full PFT completed today ? ?

## 2024-06-10 ENCOUNTER — Ambulatory Visit: Payer: Self-pay | Admitting: Student in an Organized Health Care Education/Training Program

## 2024-06-22 ENCOUNTER — Ambulatory Visit: Admitting: Student in an Organized Health Care Education/Training Program

## 2024-06-25 ENCOUNTER — Other Ambulatory Visit: Payer: Self-pay | Admitting: Nurse Practitioner

## 2024-06-25 DIAGNOSIS — Z1231 Encounter for screening mammogram for malignant neoplasm of breast: Secondary | ICD-10-CM
